# Patient Record
Sex: Male | Born: 1970 | Race: Black or African American | Hispanic: No | Marital: Single | Smoking: Never smoker
Health system: Southern US, Community
[De-identification: ages and names within clinical notes are randomized; demographics above are authoritative.]

## PROBLEM LIST (undated history)

## (undated) DIAGNOSIS — S0990XA Unspecified injury of head, initial encounter: Secondary | ICD-10-CM

## (undated) DIAGNOSIS — E039 Hypothyroidism, unspecified: Secondary | ICD-10-CM

## (undated) DIAGNOSIS — R51 Headache: Secondary | ICD-10-CM

## (undated) DIAGNOSIS — M958 Other specified acquired deformities of musculoskeletal system: Secondary | ICD-10-CM

## (undated) DIAGNOSIS — F319 Bipolar disorder, unspecified: Secondary | ICD-10-CM

## (undated) HISTORY — DX: Bipolar disorder, unspecified: F31.9

## (undated) HISTORY — DX: Other specified acquired deformities of musculoskeletal system: M95.8

## (undated) HISTORY — DX: Unspecified injury of head, initial encounter: S09.90XA

## (undated) HISTORY — DX: Headache: R51

## (undated) HISTORY — DX: Hypothyroidism, unspecified: E03.9

---

## 1997-09-18 ENCOUNTER — Emergency Department (HOSPITAL_COMMUNITY): Admission: EM | Admit: 1997-09-18 | Discharge: 1997-09-18 | Payer: Self-pay | Admitting: Emergency Medicine

## 1997-09-22 ENCOUNTER — Emergency Department (HOSPITAL_COMMUNITY): Admission: EM | Admit: 1997-09-22 | Discharge: 1997-09-22 | Payer: Self-pay | Admitting: Emergency Medicine

## 1997-10-07 ENCOUNTER — Emergency Department (HOSPITAL_COMMUNITY): Admission: EM | Admit: 1997-10-07 | Discharge: 1997-10-07 | Payer: Self-pay | Admitting: Emergency Medicine

## 1998-01-18 ENCOUNTER — Emergency Department (HOSPITAL_COMMUNITY): Admission: EM | Admit: 1998-01-18 | Discharge: 1998-01-18 | Payer: Self-pay | Admitting: Emergency Medicine

## 1998-01-24 ENCOUNTER — Emergency Department (HOSPITAL_COMMUNITY): Admission: EM | Admit: 1998-01-24 | Discharge: 1998-01-24 | Payer: Self-pay | Admitting: Internal Medicine

## 1998-01-24 ENCOUNTER — Encounter: Payer: Self-pay | Admitting: Internal Medicine

## 1998-02-20 ENCOUNTER — Emergency Department (HOSPITAL_COMMUNITY): Admission: EM | Admit: 1998-02-20 | Discharge: 1998-02-20 | Payer: Self-pay | Admitting: Emergency Medicine

## 1998-03-01 ENCOUNTER — Emergency Department (HOSPITAL_COMMUNITY): Admission: EM | Admit: 1998-03-01 | Discharge: 1998-03-01 | Payer: Self-pay | Admitting: Emergency Medicine

## 1998-03-02 ENCOUNTER — Emergency Department (HOSPITAL_COMMUNITY): Admission: EM | Admit: 1998-03-02 | Discharge: 1998-03-02 | Payer: Self-pay

## 1998-07-04 ENCOUNTER — Emergency Department (HOSPITAL_COMMUNITY): Admission: EM | Admit: 1998-07-04 | Discharge: 1998-07-05 | Payer: Self-pay | Admitting: Emergency Medicine

## 1998-08-16 ENCOUNTER — Emergency Department (HOSPITAL_COMMUNITY): Admission: EM | Admit: 1998-08-16 | Discharge: 1998-08-16 | Payer: Self-pay | Admitting: Emergency Medicine

## 1999-03-22 ENCOUNTER — Emergency Department (HOSPITAL_COMMUNITY): Admission: EM | Admit: 1999-03-22 | Discharge: 1999-03-22 | Payer: Self-pay | Admitting: Emergency Medicine

## 1999-08-02 ENCOUNTER — Encounter: Payer: Self-pay | Admitting: Emergency Medicine

## 1999-08-02 ENCOUNTER — Emergency Department (HOSPITAL_COMMUNITY): Admission: EM | Admit: 1999-08-02 | Discharge: 1999-08-02 | Payer: Self-pay | Admitting: Emergency Medicine

## 1999-08-17 ENCOUNTER — Emergency Department (HOSPITAL_COMMUNITY): Admission: EM | Admit: 1999-08-17 | Discharge: 1999-08-17 | Payer: Self-pay | Admitting: Emergency Medicine

## 1999-08-31 ENCOUNTER — Emergency Department (HOSPITAL_COMMUNITY): Admission: EM | Admit: 1999-08-31 | Discharge: 1999-08-31 | Payer: Self-pay | Admitting: Emergency Medicine

## 1999-11-18 ENCOUNTER — Emergency Department (HOSPITAL_COMMUNITY): Admission: EM | Admit: 1999-11-18 | Discharge: 1999-11-18 | Payer: Self-pay | Admitting: Emergency Medicine

## 2000-01-14 ENCOUNTER — Emergency Department (HOSPITAL_COMMUNITY): Admission: EM | Admit: 2000-01-14 | Discharge: 2000-01-15 | Payer: Self-pay | Admitting: *Deleted

## 2000-01-17 ENCOUNTER — Emergency Department (HOSPITAL_COMMUNITY): Admission: EM | Admit: 2000-01-17 | Discharge: 2000-01-17 | Payer: Self-pay | Admitting: Emergency Medicine

## 2000-05-27 ENCOUNTER — Emergency Department (HOSPITAL_COMMUNITY): Admission: EM | Admit: 2000-05-27 | Discharge: 2000-05-28 | Payer: Self-pay | Admitting: Emergency Medicine

## 2000-09-11 ENCOUNTER — Emergency Department (HOSPITAL_COMMUNITY): Admission: EM | Admit: 2000-09-11 | Discharge: 2000-09-11 | Payer: Self-pay

## 2000-11-14 ENCOUNTER — Emergency Department (HOSPITAL_COMMUNITY): Admission: EM | Admit: 2000-11-14 | Discharge: 2000-11-14 | Payer: Self-pay | Admitting: Emergency Medicine

## 2000-12-25 ENCOUNTER — Emergency Department (HOSPITAL_COMMUNITY): Admission: EM | Admit: 2000-12-25 | Discharge: 2000-12-25 | Payer: Self-pay | Admitting: Emergency Medicine

## 2001-03-20 ENCOUNTER — Emergency Department (HOSPITAL_COMMUNITY): Admission: EM | Admit: 2001-03-20 | Discharge: 2001-03-21 | Payer: Self-pay | Admitting: Emergency Medicine

## 2001-03-21 ENCOUNTER — Encounter: Payer: Self-pay | Admitting: Emergency Medicine

## 2001-04-08 ENCOUNTER — Emergency Department (HOSPITAL_COMMUNITY): Admission: EM | Admit: 2001-04-08 | Discharge: 2001-04-08 | Payer: Self-pay | Admitting: Emergency Medicine

## 2001-05-05 ENCOUNTER — Emergency Department (HOSPITAL_COMMUNITY): Admission: EM | Admit: 2001-05-05 | Discharge: 2001-05-05 | Payer: Self-pay | Admitting: Emergency Medicine

## 2001-07-13 ENCOUNTER — Emergency Department (HOSPITAL_COMMUNITY): Admission: EM | Admit: 2001-07-13 | Discharge: 2001-07-13 | Payer: Self-pay | Admitting: *Deleted

## 2001-07-15 ENCOUNTER — Emergency Department (HOSPITAL_COMMUNITY): Admission: EM | Admit: 2001-07-15 | Discharge: 2001-07-15 | Payer: Self-pay | Admitting: Emergency Medicine

## 2002-01-04 ENCOUNTER — Encounter: Payer: Self-pay | Admitting: Emergency Medicine

## 2002-01-04 ENCOUNTER — Emergency Department (HOSPITAL_COMMUNITY): Admission: EM | Admit: 2002-01-04 | Discharge: 2002-01-04 | Payer: Self-pay | Admitting: Emergency Medicine

## 2002-01-16 ENCOUNTER — Emergency Department (HOSPITAL_COMMUNITY): Admission: EM | Admit: 2002-01-16 | Discharge: 2002-01-16 | Payer: Self-pay | Admitting: Emergency Medicine

## 2002-01-20 ENCOUNTER — Encounter: Payer: Self-pay | Admitting: Emergency Medicine

## 2002-01-20 ENCOUNTER — Emergency Department (HOSPITAL_COMMUNITY): Admission: EM | Admit: 2002-01-20 | Discharge: 2002-01-20 | Payer: Self-pay | Admitting: Emergency Medicine

## 2002-02-24 ENCOUNTER — Emergency Department (HOSPITAL_COMMUNITY): Admission: EM | Admit: 2002-02-24 | Discharge: 2002-02-25 | Payer: Self-pay | Admitting: Emergency Medicine

## 2002-02-28 ENCOUNTER — Ambulatory Visit (HOSPITAL_COMMUNITY): Admission: RE | Admit: 2002-02-28 | Discharge: 2002-02-28 | Payer: Self-pay | Admitting: Orthopedic Surgery

## 2002-02-28 ENCOUNTER — Encounter: Payer: Self-pay | Admitting: Orthopedic Surgery

## 2002-05-26 ENCOUNTER — Encounter: Admission: RE | Admit: 2002-05-26 | Discharge: 2002-07-21 | Payer: Self-pay | Admitting: Orthopedic Surgery

## 2002-06-29 ENCOUNTER — Emergency Department (HOSPITAL_COMMUNITY): Admission: EM | Admit: 2002-06-29 | Discharge: 2002-06-29 | Payer: Self-pay | Admitting: Emergency Medicine

## 2002-07-19 ENCOUNTER — Encounter: Payer: Self-pay | Admitting: Orthopedic Surgery

## 2002-07-19 ENCOUNTER — Ambulatory Visit (HOSPITAL_COMMUNITY): Admission: RE | Admit: 2002-07-19 | Discharge: 2002-07-19 | Payer: Self-pay

## 2002-11-20 ENCOUNTER — Encounter: Payer: Self-pay | Admitting: Emergency Medicine

## 2002-11-20 ENCOUNTER — Emergency Department (HOSPITAL_COMMUNITY): Admission: EM | Admit: 2002-11-20 | Discharge: 2002-11-20 | Payer: Self-pay | Admitting: Emergency Medicine

## 2002-11-24 ENCOUNTER — Encounter: Payer: Self-pay | Admitting: Emergency Medicine

## 2002-11-24 ENCOUNTER — Emergency Department (HOSPITAL_COMMUNITY): Admission: EM | Admit: 2002-11-24 | Discharge: 2002-11-24 | Payer: Self-pay | Admitting: Emergency Medicine

## 2003-01-12 ENCOUNTER — Emergency Department (HOSPITAL_COMMUNITY): Admission: EM | Admit: 2003-01-12 | Discharge: 2003-01-12 | Payer: Self-pay | Admitting: Emergency Medicine

## 2003-05-23 ENCOUNTER — Emergency Department (HOSPITAL_COMMUNITY): Admission: EM | Admit: 2003-05-23 | Discharge: 2003-05-23 | Payer: Self-pay | Admitting: Emergency Medicine

## 2003-06-28 ENCOUNTER — Emergency Department (HOSPITAL_COMMUNITY): Admission: EM | Admit: 2003-06-28 | Discharge: 2003-06-28 | Payer: Self-pay | Admitting: Emergency Medicine

## 2003-06-29 ENCOUNTER — Emergency Department (HOSPITAL_COMMUNITY): Admission: EM | Admit: 2003-06-29 | Discharge: 2003-06-29 | Payer: Self-pay | Admitting: Emergency Medicine

## 2003-07-06 ENCOUNTER — Emergency Department (HOSPITAL_COMMUNITY): Admission: EM | Admit: 2003-07-06 | Discharge: 2003-07-06 | Payer: Self-pay | Admitting: Family Medicine

## 2003-07-10 ENCOUNTER — Emergency Department (HOSPITAL_COMMUNITY): Admission: EM | Admit: 2003-07-10 | Discharge: 2003-07-11 | Payer: Self-pay | Admitting: Emergency Medicine

## 2003-07-11 ENCOUNTER — Emergency Department (HOSPITAL_COMMUNITY): Admission: EM | Admit: 2003-07-11 | Discharge: 2003-07-11 | Payer: Self-pay | Admitting: Emergency Medicine

## 2003-09-21 ENCOUNTER — Emergency Department (HOSPITAL_COMMUNITY): Admission: EM | Admit: 2003-09-21 | Discharge: 2003-09-21 | Payer: Self-pay | Admitting: Emergency Medicine

## 2003-09-29 ENCOUNTER — Emergency Department (HOSPITAL_COMMUNITY): Admission: EM | Admit: 2003-09-29 | Discharge: 2003-09-29 | Payer: Self-pay | Admitting: Emergency Medicine

## 2003-10-09 ENCOUNTER — Emergency Department (HOSPITAL_COMMUNITY): Admission: EM | Admit: 2003-10-09 | Discharge: 2003-10-09 | Payer: Self-pay | Admitting: Emergency Medicine

## 2003-10-30 ENCOUNTER — Emergency Department (HOSPITAL_COMMUNITY): Admission: EM | Admit: 2003-10-30 | Discharge: 2003-10-30 | Payer: Self-pay | Admitting: Emergency Medicine

## 2003-11-17 ENCOUNTER — Emergency Department (HOSPITAL_COMMUNITY): Admission: EM | Admit: 2003-11-17 | Discharge: 2003-11-17 | Payer: Self-pay | Admitting: Emergency Medicine

## 2003-11-21 ENCOUNTER — Emergency Department (HOSPITAL_COMMUNITY): Admission: EM | Admit: 2003-11-21 | Discharge: 2003-11-21 | Payer: Self-pay | Admitting: Emergency Medicine

## 2003-12-03 ENCOUNTER — Emergency Department (HOSPITAL_COMMUNITY): Admission: EM | Admit: 2003-12-03 | Discharge: 2003-12-03 | Payer: Self-pay | Admitting: Emergency Medicine

## 2003-12-27 ENCOUNTER — Emergency Department (HOSPITAL_COMMUNITY): Admission: EM | Admit: 2003-12-27 | Discharge: 2003-12-27 | Payer: Self-pay | Admitting: Emergency Medicine

## 2004-01-01 ENCOUNTER — Emergency Department (HOSPITAL_COMMUNITY): Admission: EM | Admit: 2004-01-01 | Discharge: 2004-01-01 | Payer: Self-pay

## 2004-01-19 ENCOUNTER — Emergency Department (HOSPITAL_COMMUNITY): Admission: EM | Admit: 2004-01-19 | Discharge: 2004-01-19 | Payer: Self-pay | Admitting: Emergency Medicine

## 2004-01-23 ENCOUNTER — Emergency Department (HOSPITAL_COMMUNITY): Admission: EM | Admit: 2004-01-23 | Discharge: 2004-01-24 | Payer: Self-pay | Admitting: Emergency Medicine

## 2004-01-30 ENCOUNTER — Emergency Department (HOSPITAL_COMMUNITY): Admission: EM | Admit: 2004-01-30 | Discharge: 2004-01-31 | Payer: Self-pay | Admitting: Emergency Medicine

## 2004-02-01 ENCOUNTER — Emergency Department (HOSPITAL_COMMUNITY): Admission: EM | Admit: 2004-02-01 | Discharge: 2004-02-01 | Payer: Self-pay | Admitting: Emergency Medicine

## 2004-12-21 ENCOUNTER — Emergency Department (HOSPITAL_COMMUNITY): Admission: EM | Admit: 2004-12-21 | Discharge: 2004-12-21 | Payer: Self-pay | Admitting: Emergency Medicine

## 2004-12-29 ENCOUNTER — Emergency Department (HOSPITAL_COMMUNITY): Admission: EM | Admit: 2004-12-29 | Discharge: 2004-12-29 | Payer: Self-pay | Admitting: Emergency Medicine

## 2005-01-03 ENCOUNTER — Emergency Department (HOSPITAL_COMMUNITY): Admission: EM | Admit: 2005-01-03 | Discharge: 2005-01-03 | Payer: Self-pay | Admitting: Emergency Medicine

## 2005-01-05 ENCOUNTER — Emergency Department (HOSPITAL_COMMUNITY): Admission: EM | Admit: 2005-01-05 | Discharge: 2005-01-05 | Payer: Self-pay | Admitting: Emergency Medicine

## 2005-01-06 ENCOUNTER — Emergency Department (HOSPITAL_COMMUNITY): Admission: EM | Admit: 2005-01-06 | Discharge: 2005-01-06 | Payer: Self-pay | Admitting: Emergency Medicine

## 2005-01-10 ENCOUNTER — Emergency Department (HOSPITAL_COMMUNITY): Admission: EM | Admit: 2005-01-10 | Discharge: 2005-01-10 | Payer: Self-pay | Admitting: Emergency Medicine

## 2005-02-06 ENCOUNTER — Emergency Department (HOSPITAL_COMMUNITY): Admission: EM | Admit: 2005-02-06 | Discharge: 2005-02-06 | Payer: Self-pay | Admitting: Emergency Medicine

## 2005-04-23 ENCOUNTER — Emergency Department (HOSPITAL_COMMUNITY): Admission: EM | Admit: 2005-04-23 | Discharge: 2005-04-23 | Payer: Self-pay | Admitting: Emergency Medicine

## 2005-05-02 ENCOUNTER — Emergency Department (HOSPITAL_COMMUNITY): Admission: EM | Admit: 2005-05-02 | Discharge: 2005-05-02 | Payer: Self-pay | Admitting: Emergency Medicine

## 2005-05-10 ENCOUNTER — Emergency Department (HOSPITAL_COMMUNITY): Admission: EM | Admit: 2005-05-10 | Discharge: 2005-05-10 | Payer: Self-pay | Admitting: Emergency Medicine

## 2005-10-18 ENCOUNTER — Emergency Department (HOSPITAL_COMMUNITY): Admission: EM | Admit: 2005-10-18 | Discharge: 2005-10-18 | Payer: Self-pay | Admitting: Emergency Medicine

## 2005-11-29 ENCOUNTER — Emergency Department (HOSPITAL_COMMUNITY): Admission: EM | Admit: 2005-11-29 | Discharge: 2005-11-29 | Payer: Self-pay | Admitting: Emergency Medicine

## 2006-01-13 ENCOUNTER — Emergency Department (HOSPITAL_COMMUNITY): Admission: EM | Admit: 2006-01-13 | Discharge: 2006-01-13 | Payer: Self-pay | Admitting: Emergency Medicine

## 2006-03-01 ENCOUNTER — Emergency Department (HOSPITAL_COMMUNITY): Admission: EM | Admit: 2006-03-01 | Discharge: 2006-03-01 | Payer: Self-pay | Admitting: Emergency Medicine

## 2006-05-12 ENCOUNTER — Emergency Department (HOSPITAL_COMMUNITY): Admission: EM | Admit: 2006-05-12 | Discharge: 2006-05-12 | Payer: Self-pay | Admitting: Emergency Medicine

## 2006-05-12 ENCOUNTER — Emergency Department (HOSPITAL_COMMUNITY): Admission: EM | Admit: 2006-05-12 | Discharge: 2006-05-13 | Payer: Self-pay | Admitting: Emergency Medicine

## 2006-05-19 ENCOUNTER — Emergency Department (HOSPITAL_COMMUNITY): Admission: EM | Admit: 2006-05-19 | Discharge: 2006-05-19 | Payer: Self-pay | Admitting: Emergency Medicine

## 2006-05-26 ENCOUNTER — Emergency Department (HOSPITAL_COMMUNITY): Admission: EM | Admit: 2006-05-26 | Discharge: 2006-05-26 | Payer: Self-pay | Admitting: Family Medicine

## 2006-06-30 ENCOUNTER — Emergency Department (HOSPITAL_COMMUNITY): Admission: EM | Admit: 2006-06-30 | Discharge: 2006-06-30 | Payer: Self-pay | Admitting: *Deleted

## 2006-07-07 ENCOUNTER — Emergency Department (HOSPITAL_COMMUNITY): Admission: EM | Admit: 2006-07-07 | Discharge: 2006-07-07 | Payer: Self-pay | Admitting: Family Medicine

## 2006-11-19 ENCOUNTER — Emergency Department (HOSPITAL_COMMUNITY): Admission: EM | Admit: 2006-11-19 | Discharge: 2006-11-19 | Payer: Self-pay | Admitting: Emergency Medicine

## 2006-12-03 ENCOUNTER — Emergency Department (HOSPITAL_COMMUNITY): Admission: EM | Admit: 2006-12-03 | Discharge: 2006-12-03 | Payer: Self-pay | Admitting: Emergency Medicine

## 2006-12-04 ENCOUNTER — Emergency Department (HOSPITAL_COMMUNITY): Admission: EM | Admit: 2006-12-04 | Discharge: 2006-12-04 | Payer: Self-pay | Admitting: Emergency Medicine

## 2007-01-25 ENCOUNTER — Emergency Department (HOSPITAL_COMMUNITY): Admission: EM | Admit: 2007-01-25 | Discharge: 2007-01-25 | Payer: Self-pay | Admitting: Emergency Medicine

## 2007-07-24 ENCOUNTER — Emergency Department (HOSPITAL_COMMUNITY): Admission: EM | Admit: 2007-07-24 | Discharge: 2007-07-24 | Payer: Self-pay | Admitting: Emergency Medicine

## 2007-08-15 ENCOUNTER — Emergency Department (HOSPITAL_COMMUNITY): Admission: EM | Admit: 2007-08-15 | Discharge: 2007-08-15 | Payer: Self-pay | Admitting: Emergency Medicine

## 2008-03-25 DIAGNOSIS — S0990XA Unspecified injury of head, initial encounter: Secondary | ICD-10-CM

## 2008-03-25 HISTORY — DX: Unspecified injury of head, initial encounter: S09.90XA

## 2008-07-23 ENCOUNTER — Emergency Department (HOSPITAL_COMMUNITY): Admission: EM | Admit: 2008-07-23 | Discharge: 2008-07-23 | Payer: Self-pay | Admitting: Emergency Medicine

## 2008-07-23 HISTORY — PX: OTHER SURGICAL HISTORY: SHX169

## 2008-07-24 ENCOUNTER — Emergency Department (HOSPITAL_COMMUNITY): Admission: EM | Admit: 2008-07-24 | Discharge: 2008-07-24 | Payer: Self-pay | Admitting: Emergency Medicine

## 2008-07-27 ENCOUNTER — Emergency Department (HOSPITAL_COMMUNITY): Admission: EM | Admit: 2008-07-27 | Discharge: 2008-07-27 | Payer: Self-pay | Admitting: Emergency Medicine

## 2008-07-28 ENCOUNTER — Emergency Department (HOSPITAL_COMMUNITY): Admission: EM | Admit: 2008-07-28 | Discharge: 2008-07-28 | Payer: Self-pay | Admitting: Emergency Medicine

## 2008-07-30 ENCOUNTER — Emergency Department (HOSPITAL_COMMUNITY): Admission: EM | Admit: 2008-07-30 | Discharge: 2008-07-31 | Payer: Self-pay | Admitting: Emergency Medicine

## 2008-08-05 ENCOUNTER — Emergency Department (HOSPITAL_COMMUNITY): Admission: EM | Admit: 2008-08-05 | Discharge: 2008-08-05 | Payer: Self-pay | Admitting: Emergency Medicine

## 2008-08-12 ENCOUNTER — Emergency Department (HOSPITAL_COMMUNITY): Admission: EM | Admit: 2008-08-12 | Discharge: 2008-08-12 | Payer: Self-pay | Admitting: Emergency Medicine

## 2008-08-13 ENCOUNTER — Emergency Department (HOSPITAL_COMMUNITY): Admission: EM | Admit: 2008-08-13 | Discharge: 2008-08-13 | Payer: Self-pay | Admitting: Emergency Medicine

## 2008-08-30 ENCOUNTER — Ambulatory Visit: Payer: Self-pay | Admitting: Internal Medicine

## 2008-08-30 ENCOUNTER — Encounter (INDEPENDENT_AMBULATORY_CARE_PROVIDER_SITE_OTHER): Payer: Self-pay | Admitting: *Deleted

## 2008-08-30 DIAGNOSIS — M2180 Other specified acquired deformities of unspecified limb: Secondary | ICD-10-CM | POA: Insufficient documentation

## 2008-08-30 DIAGNOSIS — R51 Headache: Secondary | ICD-10-CM | POA: Insufficient documentation

## 2008-08-30 DIAGNOSIS — F319 Bipolar disorder, unspecified: Secondary | ICD-10-CM | POA: Insufficient documentation

## 2008-08-30 DIAGNOSIS — R519 Headache, unspecified: Secondary | ICD-10-CM | POA: Insufficient documentation

## 2008-08-30 DIAGNOSIS — H538 Other visual disturbances: Secondary | ICD-10-CM | POA: Insufficient documentation

## 2008-10-31 IMAGING — CR DG SHOULDER 2+V*R*
4 series · 4 of 4 positions shown · non-contrast
Comparison: 11/29/04.

CLINICAL DATA: Pain.
 RIGHT SHOULDER ? 4 VIEW:

[w shoulder ap internal right]
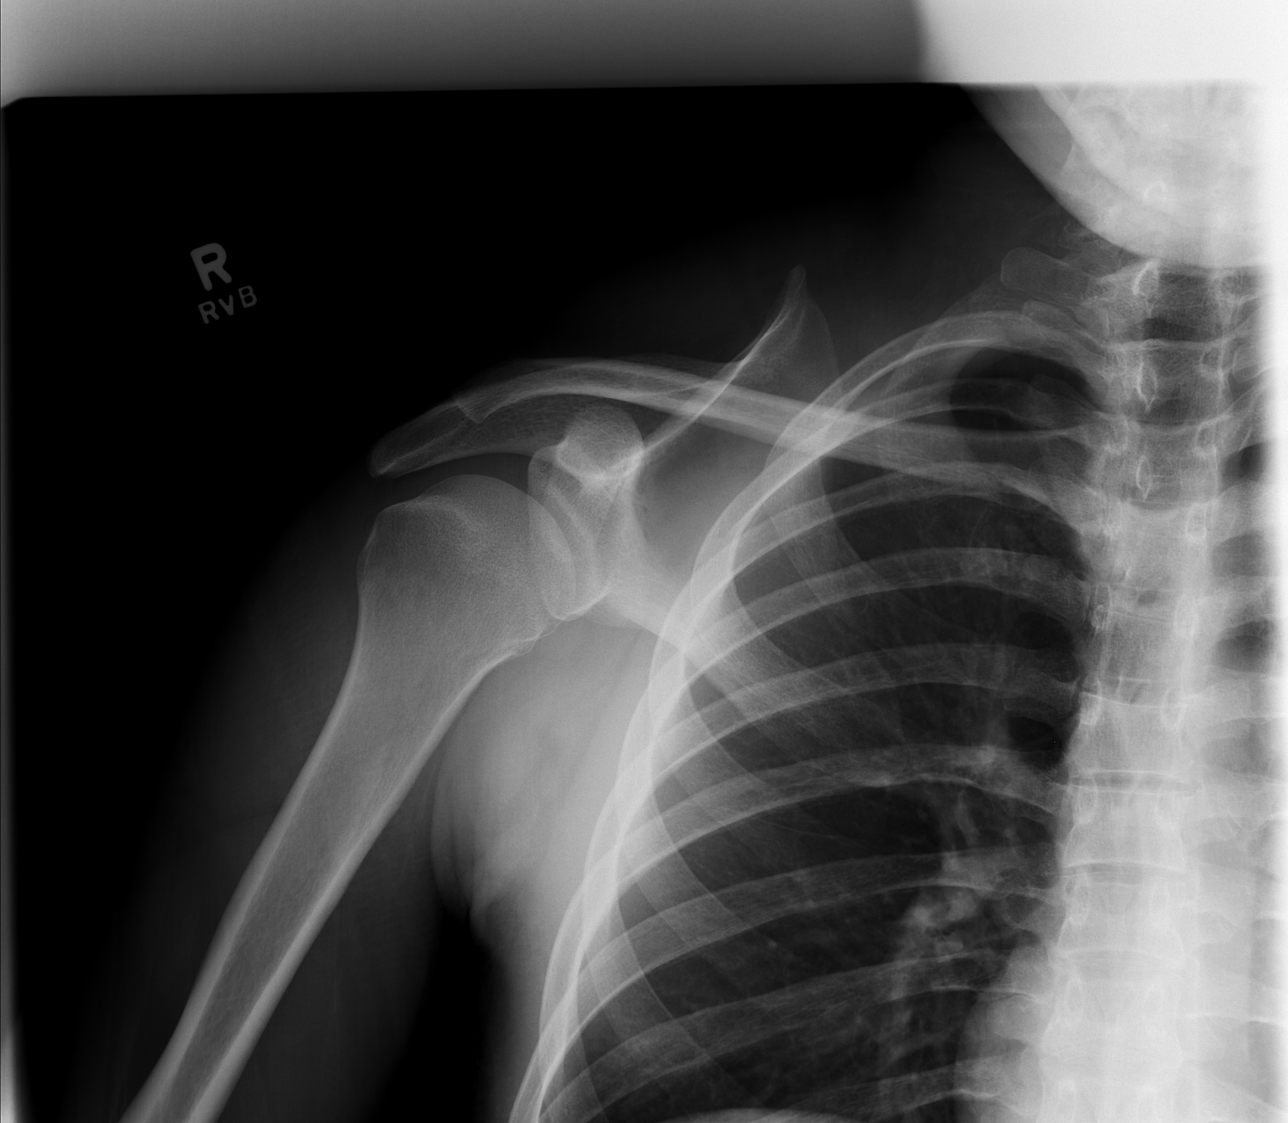

[w shoulder ap external right]
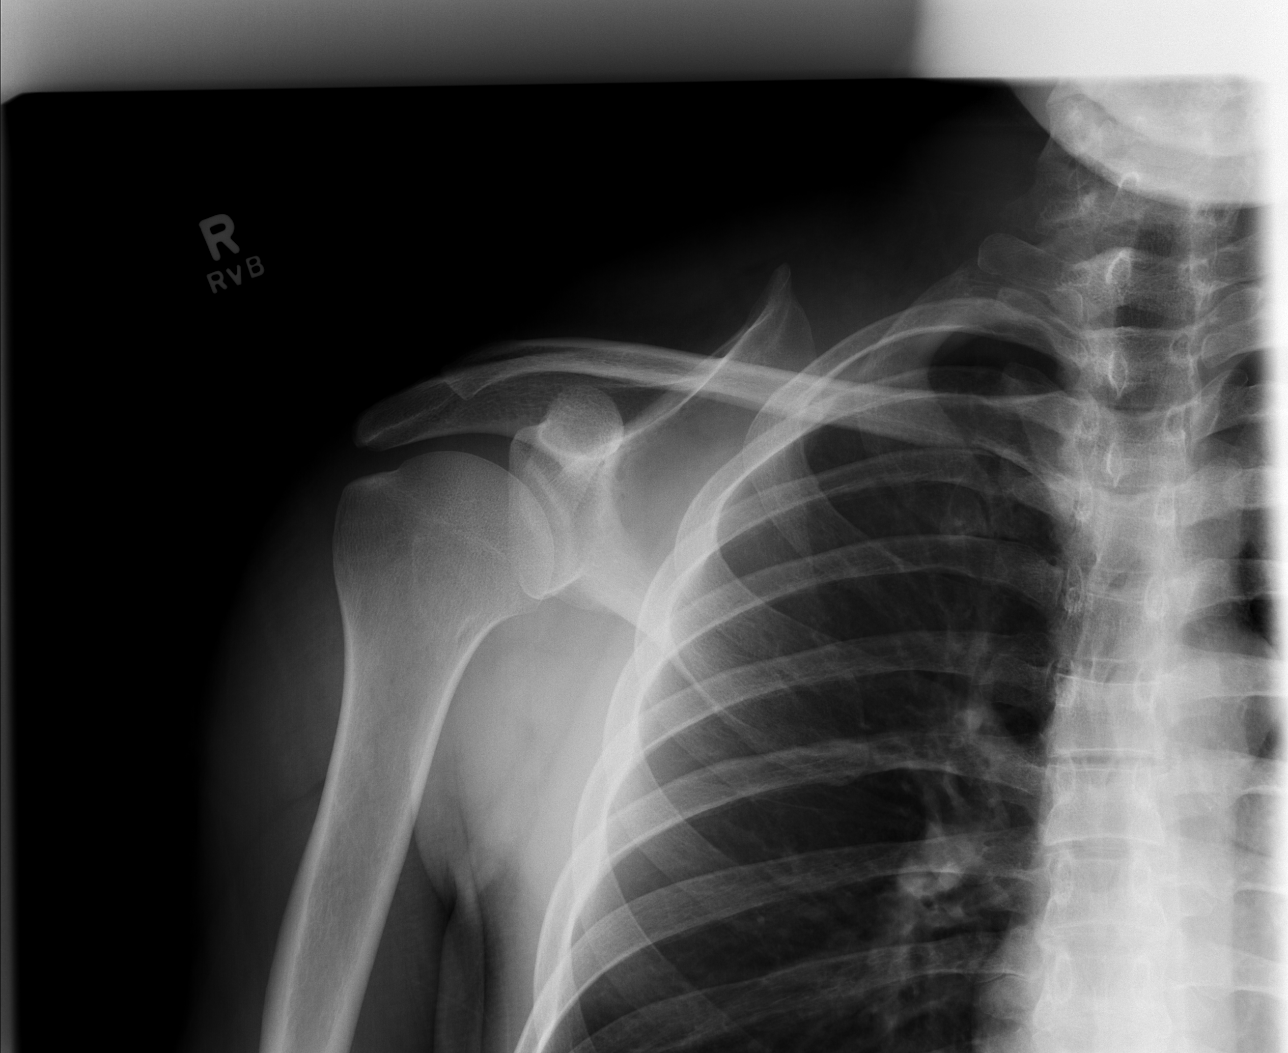

[w shoulder y view right (1 of 2)]
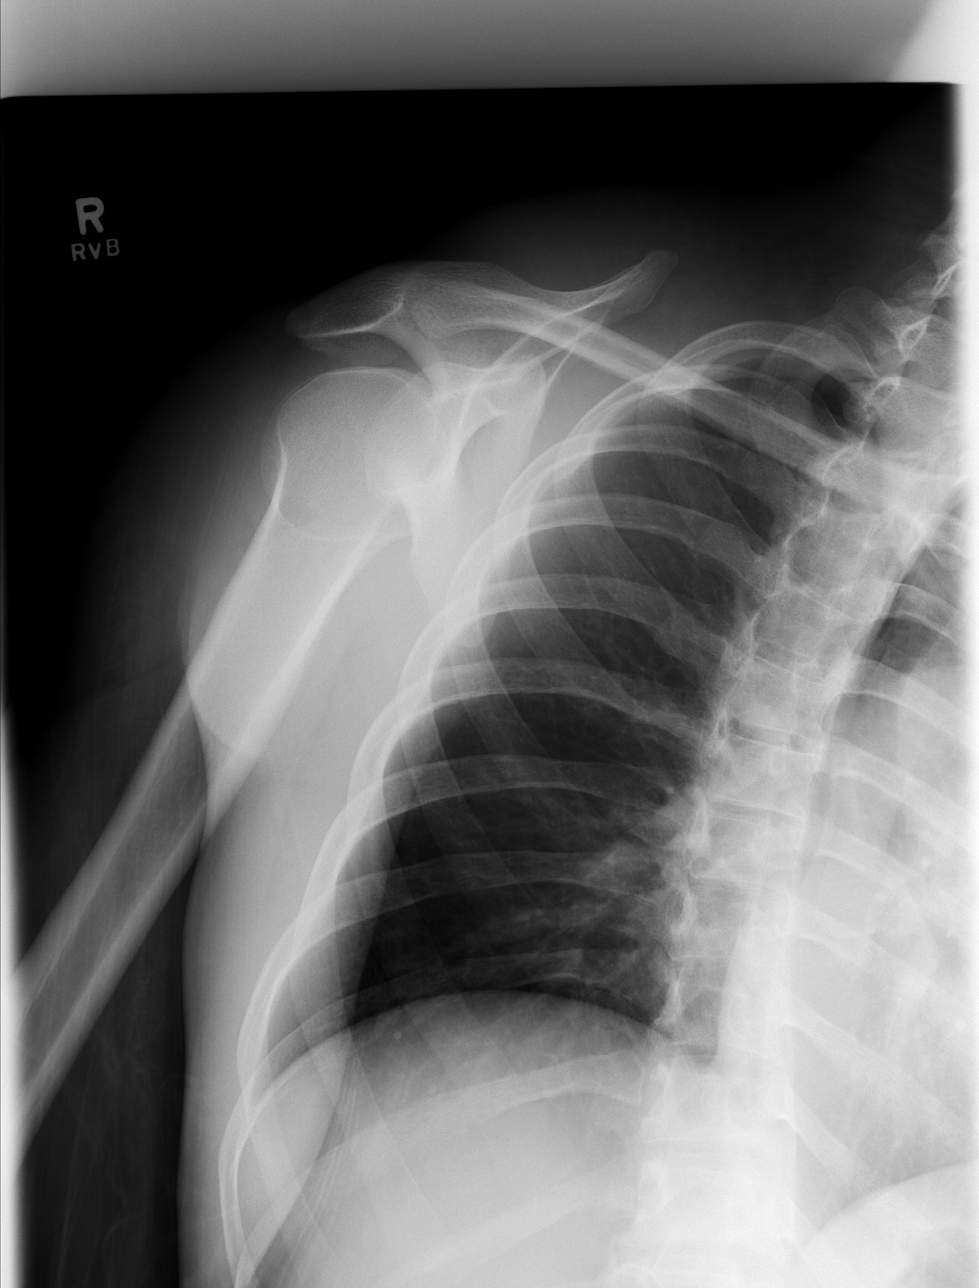

[w shoulder y view right (2 of 2)]
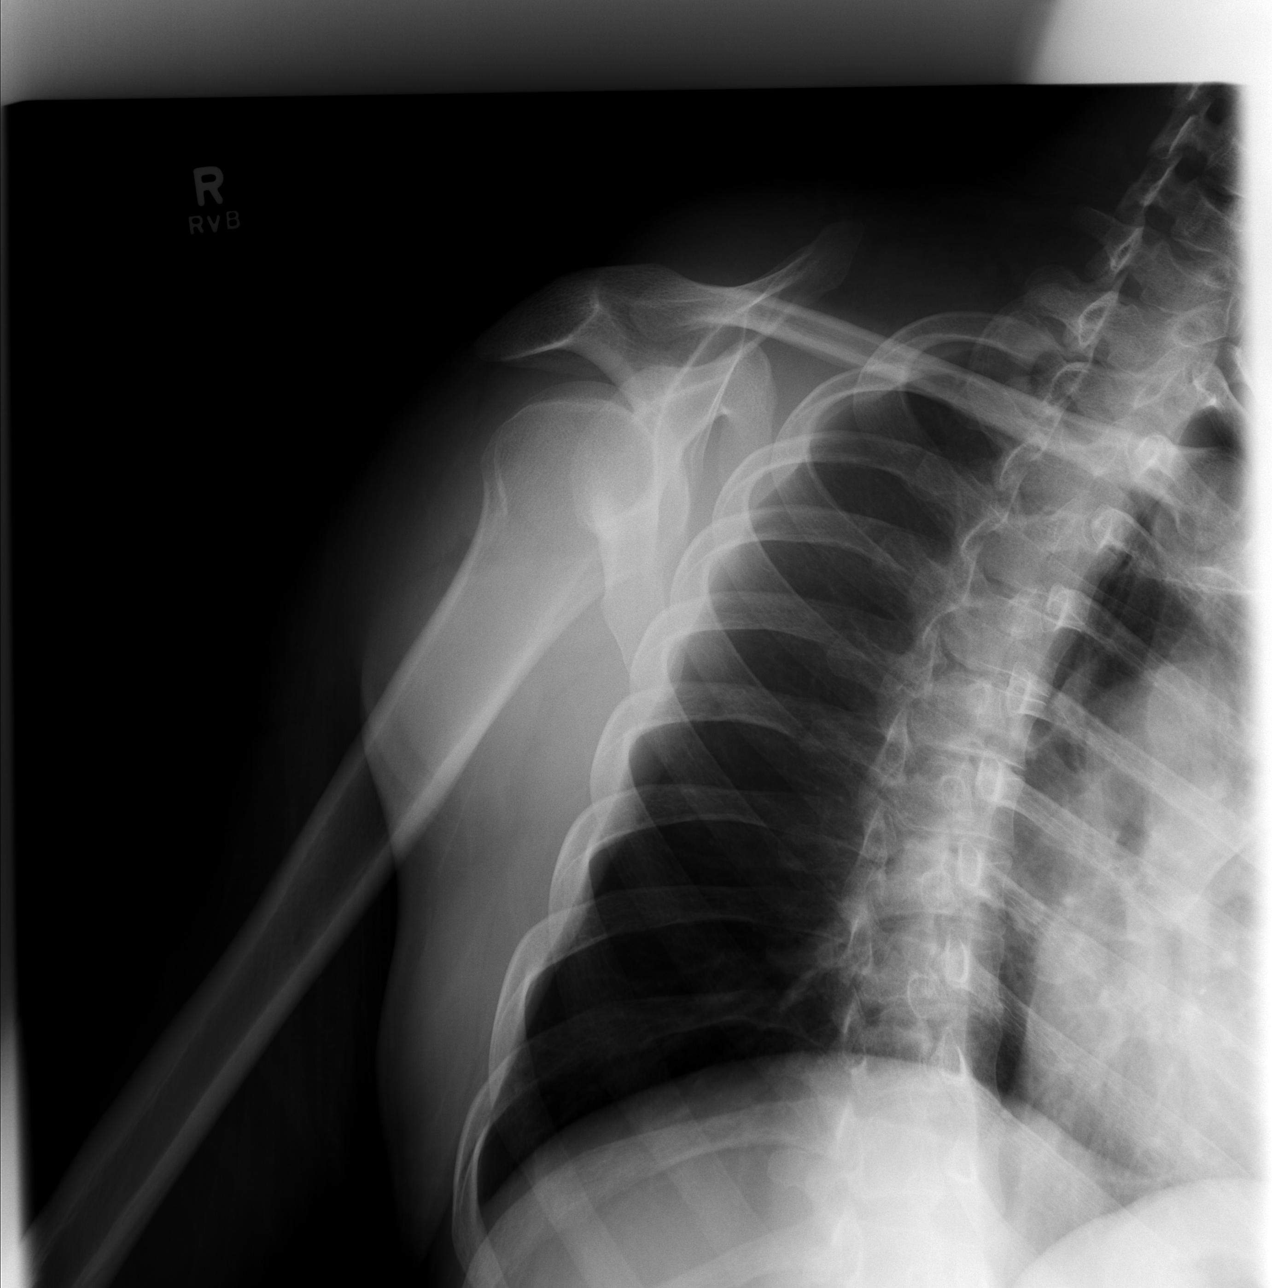

[4 of 4 positions shown; findings below may reference images not displayed]

FINDINGS: The humerus is located.  No fracture is identified.  Winged appearance of the scapula is again noted.  Imaged ribs and lung are clear.
IMPRESSION: No acute finding or interval change.

## 2008-12-05 ENCOUNTER — Emergency Department (HOSPITAL_COMMUNITY): Admission: EM | Admit: 2008-12-05 | Discharge: 2008-12-05 | Payer: Self-pay | Admitting: Emergency Medicine

## 2009-01-17 ENCOUNTER — Encounter: Payer: Self-pay | Admitting: Internal Medicine

## 2009-04-02 ENCOUNTER — Emergency Department (HOSPITAL_COMMUNITY): Admission: EM | Admit: 2009-04-02 | Discharge: 2009-04-02 | Payer: Self-pay | Admitting: Emergency Medicine

## 2009-08-08 ENCOUNTER — Encounter: Payer: Self-pay | Admitting: Internal Medicine

## 2009-08-08 ENCOUNTER — Ambulatory Visit: Payer: Self-pay | Admitting: Internal Medicine

## 2009-08-08 DIAGNOSIS — R5383 Other fatigue: Secondary | ICD-10-CM

## 2009-08-08 DIAGNOSIS — R5381 Other malaise: Secondary | ICD-10-CM | POA: Insufficient documentation

## 2009-08-09 ENCOUNTER — Encounter: Payer: Self-pay | Admitting: Internal Medicine

## 2009-08-09 LAB — CONVERTED CEMR LAB
ALT: <8 units/L (ref 0–53)
AST: 18 units/L (ref 0–37)
Albumin: 4.3 g/dL (ref 3.5–5.2)
Alkaline Phosphatase: 44 units/L (ref 39–117)
BUN: 15 mg/dL (ref 6–23)
Basophils Absolute: 0 10*3/uL (ref 0.0–0.1)
Basophils Relative: 1 % (ref 0–1)
CO2: 26 meq/L (ref 19–32)
Calcium: 8.9 mg/dL (ref 8.4–10.5)
Chloride: 103 meq/L (ref 96–112)
Creatinine, Ser: 1.37 mg/dL (ref 0.40–1.50)
Eosinophils Absolute: 0.4 10*3/uL (ref 0.0–0.7)
Eosinophils Relative: 6 % — ABNORMAL HIGH (ref 0–5)
Glucose, Bld: 66 mg/dL — ABNORMAL LOW (ref 70–99)
HCT: 43.7 % (ref 39.0–52.0)
Hemoglobin: 14.8 g/dL (ref 13.0–17.0)
Lymphocytes Relative: 45 % (ref 12–46)
Lymphs Abs: 2.5 10*3/uL (ref 0.7–4.0)
MCHC: 33.9 g/dL (ref 30.0–36.0)
MCV: 94.2 fL (ref >=78.0)
Monocytes Absolute: 0.5 10*3/uL (ref 0.1–1.0)
Monocytes Relative: 9 % (ref 3–12)
Neutro Abs: 2.2 10*3/uL (ref 1.7–7.7)
Neutrophils Relative %: 40 % — ABNORMAL LOW (ref 43–77)
Platelets: 195 10*3/uL (ref 150–400)
Potassium: 4.1 meq/L (ref 3.5–5.3)
RBC: 4.64 M/uL (ref 4.22–5.81)
RDW: 14.2 % (ref 11.5–15.5)
Sodium: 140 meq/L (ref 135–145)
TSH: 6.795 microintl units/mL — ABNORMAL HIGH (ref 0.350–4.5)
Total Bilirubin: 0.3 mg/dL (ref 0.3–1.2)
Total Protein: 7.4 g/dL (ref 6.0–8.3)
WBC: 5.7 10*3/uL (ref 4.0–10.5)

## 2009-08-11 DIAGNOSIS — E039 Hypothyroidism, unspecified: Secondary | ICD-10-CM | POA: Insufficient documentation

## 2009-08-11 LAB — CONVERTED CEMR LAB: Free T4: 0.74 ng/dL — ABNORMAL LOW (ref 0.80–1.80)

## 2009-09-07 ENCOUNTER — Telehealth: Payer: Self-pay | Admitting: Internal Medicine

## 2009-10-19 ENCOUNTER — Encounter: Payer: Self-pay | Admitting: Internal Medicine

## 2009-11-22 ENCOUNTER — Emergency Department (HOSPITAL_COMMUNITY): Admission: EM | Admit: 2009-11-22 | Discharge: 2009-11-22 | Payer: Self-pay | Admitting: Emergency Medicine
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
## 2010-04-24 NOTE — Progress Notes (Signed)
Summary: phone/gg  Phone Note Call from Patient   Summary of Call: Pt came by office and was concerned about his Dx. and taking synthroid.   He would love a call from you to explain alittle about both.   He is anxious. His # is [phone] Initial call taken by: Merrie Roof RN,  September 07, 2009 4:56 PM  Follow-up for Phone Call        Called him twice today and nobody is picking up the phone.  Follow-up by: Blondell Reveal MD,  September 07, 2009 5:38 PM  Additional Follow-up for Phone Call Additional follow up Details #1::        Called at [phone] and talked to patients Guardian, who said the patient started taking his synthroid and has been doing well since started on this medicine. I recommended to call at [phone] if he wants to talk to me.

## 2010-05-13 ENCOUNTER — Emergency Department (HOSPITAL_COMMUNITY)
Admission: EM | Admit: 2010-05-13 | Discharge: 2010-05-13 | Disposition: A | Discharge status: Home / Self Care | Payer: Self-pay | Attending: Emergency Medicine | Admitting: Emergency Medicine

## 2010-05-13 DIAGNOSIS — E039 Hypothyroidism, unspecified: Secondary | ICD-10-CM | POA: Insufficient documentation

## 2010-05-13 DIAGNOSIS — M79609 Pain in unspecified limb: Secondary | ICD-10-CM | POA: Insufficient documentation

## 2010-05-13 DIAGNOSIS — B353 Tinea pedis: Secondary | ICD-10-CM | POA: Insufficient documentation

## 2010-06-07 LAB — GLUCOSE, CAPILLARY: Glucose-Capillary: 94 mg/dL (ref 70–99)

## 2010-07-03 LAB — POCT I-STAT, CHEM 8
BUN: 9 mg/dL (ref 6–23)
Calcium, Ion: 1.09 mmol/L — ABNORMAL LOW (ref 1.12–1.32)
Chloride: 103 mEq/L (ref 96–112)
Creatinine, Ser: 1.9 mg/dL — ABNORMAL HIGH (ref 0.4–1.5)
Glucose, Bld: 92 mg/dL (ref 70–99)
HCT: 48 % (ref 39.0–52.0)
Hemoglobin: 16.3 g/dL (ref 13.0–17.0)
Potassium: 3.8 mEq/L (ref 3.5–5.1)
Sodium: 139 mEq/L (ref 135–145)
TCO2: 25 mmol/L (ref 0–100)

## 2010-07-03 LAB — POCT CARDIAC MARKERS
CKMB, poc: 1.1 ng/mL (ref 1.0–8.0)
Myoglobin, poc: 71.5 ng/mL (ref 12–200)
Troponin i, poc: <0.05 ng/mL (ref 0.00–0.09)

## 2010-07-10 ENCOUNTER — Encounter: Payer: Self-pay | Admitting: Ophthalmology

## 2010-10-23 ENCOUNTER — Encounter: Payer: Self-pay | Admitting: Internal Medicine

## 2010-10-23 ENCOUNTER — Ambulatory Visit (INDEPENDENT_AMBULATORY_CARE_PROVIDER_SITE_OTHER): Payer: Self-pay | Admitting: Internal Medicine

## 2010-10-23 VITALS — BP 109/70 | HR 99 | Temp 98.6°F | Ht 66.0 in | Wt 156.8 lb

## 2010-10-23 DIAGNOSIS — M2180 Other specified acquired deformities of unspecified limb: Secondary | ICD-10-CM

## 2010-10-23 DIAGNOSIS — E039 Hypothyroidism, unspecified: Secondary | ICD-10-CM

## 2010-10-23 DIAGNOSIS — F319 Bipolar disorder, unspecified: Secondary | ICD-10-CM

## 2010-10-23 LAB — TSH: TSH: 2.908 u[IU]/mL (ref 0.350–4.500)

## 2010-10-23 MED ORDER — LEVOTHYROXINE SODIUM 25 MCG PO TABS: 25.0000 ug | ORAL_TABLET | Freq: Every day | ORAL | Status: DC

## 2010-10-23 MED ORDER — MELOXICAM 7.5 MG PO TABS: 7.5000 mg | ORAL_TABLET | Freq: Every day | ORAL | Status: DC
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
## 2010-10-23 NOTE — Assessment & Plan Note (Signed)
Winged scapula, causing pain. Will try patient on Meloxicam once a day.

## 2010-10-23 NOTE — Patient Instructions (Signed)
Please follow up at [facility] to get medications for Bipolar Disorder.  Please follow up with Jaynee Eagles for financial counseling.  Please restart taking Thyroid medication once in the morning before breakfast.

## 2010-10-23 NOTE — Assessment & Plan Note (Signed)
Patient has a history of hypothyroidism with last TSH checked over one year ago. Patient has not been taking his thyroid medication as directed. Patient is complaining of fatigue as well as changes with hair. This is likely secondary to poorly controlled hypothyroidism. Patient will be restarted on Synthroid at original dose of 25 mcg. Patient will get a TSH and free T4 level checked today. We will adjust the dose of Synthroid according to his thyroid function tests.

## 2010-10-24 NOTE — Assessment & Plan Note (Signed)
Patient with complaints of fluctuating energy levels, paranoia, mood swings, and being easily distracted. Patient states that when he starts to feel like this he needs to be on medication. However patient stopped taking his medication as he was told that he would have to pay for his medications. He is unable to afford them and discuss stopped going to the behavioral [facility]. Patient was provided with 2 small boxes of Seroquel samples with 4 tablets in as patient was previously on Seroquel. Patient was instructed to [facility] with [facility] to get restarted on his medications. Patient reports that he was planning on going to the health department after this appointment to schedule followup. Patient will also followup with Jaynee Eagles for financial counseling in obtaining the [facility] card.

## 2010-10-25 LAB — T4, FREE: Free T4: 0.76 NG/DL — ABNORMAL LOW (ref 0.80–1.70)

## 2010-12-05 ENCOUNTER — Emergency Department (HOSPITAL_COMMUNITY)
Admission: EM | Admit: 2010-12-05 | Discharge: 2010-12-05 | Disposition: A | Discharge status: Home / Self Care | Payer: Self-pay | Attending: Emergency Medicine | Admitting: Emergency Medicine

## 2010-12-05 DIAGNOSIS — F3130 Bipolar disorder, current episode depressed, mild or moderate severity, unspecified: Secondary | ICD-10-CM | POA: Insufficient documentation

## 2010-12-05 DIAGNOSIS — E039 Hypothyroidism, unspecified: Secondary | ICD-10-CM | POA: Insufficient documentation

## 2010-12-05 DIAGNOSIS — Z79899 Other long term (current) drug therapy: Secondary | ICD-10-CM | POA: Insufficient documentation

## 2010-12-05 DIAGNOSIS — F411 Generalized anxiety disorder: Secondary | ICD-10-CM | POA: Insufficient documentation

## 2010-12-05 DIAGNOSIS — R443 Hallucinations, unspecified: Secondary | ICD-10-CM | POA: Insufficient documentation

## 2010-12-09 ENCOUNTER — Emergency Department (HOSPITAL_COMMUNITY)
Admission: EM | Admit: 2010-12-09 | Discharge: 2010-12-10 | Disposition: A | Discharge status: Home / Self Care | Payer: Self-pay | Attending: Emergency Medicine | Admitting: Emergency Medicine

## 2010-12-09 DIAGNOSIS — R5381 Other malaise: Secondary | ICD-10-CM | POA: Insufficient documentation

## 2010-12-09 DIAGNOSIS — R05 Cough: Secondary | ICD-10-CM | POA: Insufficient documentation

## 2010-12-09 DIAGNOSIS — R059 Cough, unspecified: Secondary | ICD-10-CM | POA: Insufficient documentation

## 2010-12-09 DIAGNOSIS — E039 Hypothyroidism, unspecified: Secondary | ICD-10-CM | POA: Insufficient documentation

## 2010-12-09 DIAGNOSIS — R5383 Other fatigue: Secondary | ICD-10-CM | POA: Insufficient documentation

## 2010-12-10 LAB — POCT I-STAT, CHEM 8
BUN: 17 mg/dL (ref 6–23)
Calcium, Ion: 1.14 mmol/L (ref 1.12–1.32)
Chloride: 105 mEq/L (ref 96–112)
Creatinine, Ser: 1.3 mg/dL (ref 0.50–1.35)
HCT: 47 % (ref 39.0–52.0)
Hemoglobin: 16 g/dL (ref 13.0–17.0)
Potassium: 3.7 mEq/L (ref 3.5–5.1)
Sodium: 141 mEq/L (ref 135–145)
TCO2: 24 mmol/L (ref 0–100)

## 2011-01-30 ENCOUNTER — Encounter (HOSPITAL_COMMUNITY): Payer: Self-pay | Admitting: *Deleted

## 2011-01-30 ENCOUNTER — Emergency Department (HOSPITAL_COMMUNITY)
Admission: EM | Admit: 2011-01-30 | Discharge: 2011-01-31 | Disposition: A | Discharge status: Home / Self Care | Payer: Self-pay | Attending: Emergency Medicine | Admitting: Emergency Medicine

## 2011-01-30 DIAGNOSIS — J029 Acute pharyngitis, unspecified: Secondary | ICD-10-CM | POA: Insufficient documentation

## 2011-01-30 DIAGNOSIS — E039 Hypothyroidism, unspecified: Secondary | ICD-10-CM | POA: Insufficient documentation

## 2011-01-30 DIAGNOSIS — F319 Bipolar disorder, unspecified: Secondary | ICD-10-CM | POA: Insufficient documentation

## 2011-01-30 DIAGNOSIS — S46911A Strain of unspecified muscle, fascia and tendon at shoulder and upper arm level, right arm, initial encounter: Secondary | ICD-10-CM

## 2011-01-30 DIAGNOSIS — IMO0002 Reserved for concepts with insufficient information to code with codable children: Secondary | ICD-10-CM | POA: Insufficient documentation

## 2011-01-30 DIAGNOSIS — X58XXXA Exposure to other specified factors, initial encounter: Secondary | ICD-10-CM | POA: Insufficient documentation

## 2011-01-30 NOTE — ED Notes (Signed)
Pt in c/o sore throat and right shoulder pain x2 days

## 2011-01-31 LAB — RAPID STREP SCREEN (MED CTR MEBANE ONLY): Streptococcus, Group A Screen (Direct): NEGATIVE

## 2011-01-31 MED ORDER — DICLOFENAC SODIUM 75 MG PO TBEC: 75.0000 mg | DELAYED_RELEASE_TABLET | Freq: Two times a day (BID) | ORAL | Status: AC

## 2011-01-31 MED ORDER — LIDOCAINE VISCOUS 2 % MT SOLN: 20.0000 mL | OROMUCOSAL | Status: AC | PRN

## 2011-01-31 MED ORDER — METHOCARBAMOL 500 MG PO TABS: 500.0000 mg | ORAL_TABLET | Freq: Two times a day (BID) | ORAL | Status: AC
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
## 2011-01-31 NOTE — ED Provider Notes (Signed)
Medical screening examination/treatment/procedure(s) were conducted as a shared visit with non-physician practitioner(s) and myself.  I personally evaluated the patient during the encounter  Olivia Mackie, MD 01/31/11 [phone]

## 2011-02-04 NOTE — ED Provider Notes (Signed)
Medical screening examination/treatment/procedure(s) were performed by non-physician practitioner and as supervising physician I was immediately available for consultation/collaboration.  Olivia Mackie, MD 02/04/11 [phone]

## 2013-07-05 ENCOUNTER — Encounter (HOSPITAL_COMMUNITY): Payer: Self-pay | Admitting: Emergency Medicine

## 2013-07-05 ENCOUNTER — Emergency Department (HOSPITAL_COMMUNITY)
Admission: EM | Admit: 2013-07-05 | Discharge: 2013-07-05 | Disposition: A | Discharge status: Home / Self Care | Payer: Medicaid - Out of State | Attending: Emergency Medicine | Admitting: Emergency Medicine

## 2013-07-05 DIAGNOSIS — E039 Hypothyroidism, unspecified: Secondary | ICD-10-CM | POA: Insufficient documentation

## 2013-07-05 DIAGNOSIS — Z87828 Personal history of other (healed) physical injury and trauma: Secondary | ICD-10-CM | POA: Insufficient documentation

## 2013-07-05 DIAGNOSIS — Z79899 Other long term (current) drug therapy: Secondary | ICD-10-CM | POA: Insufficient documentation

## 2013-07-05 DIAGNOSIS — Z760 Encounter for issue of repeat prescription: Secondary | ICD-10-CM | POA: Insufficient documentation

## 2013-07-05 DIAGNOSIS — Z8739 Personal history of other diseases of the musculoskeletal system and connective tissue: Secondary | ICD-10-CM | POA: Insufficient documentation

## 2013-07-05 DIAGNOSIS — Z8659 Personal history of other mental and behavioral disorders: Secondary | ICD-10-CM | POA: Insufficient documentation

## 2013-07-05 MED ORDER — LEVOTHYROXINE SODIUM 75 MCG PO TABS: 75.0000 ug | ORAL_TABLET | Freq: Every day | ORAL | Status: DC

## 2013-07-05 NOTE — ED Notes (Addendum)
Patient reports that he has been out of thyroid medicine and bi-polar medicatons x 5 days. Patient is requesting medication refill.

## 2013-07-05 NOTE — Discharge Instructions (Signed)
Take the prescribed medication as directed. Follow-up with one of the behavior health clinics to establish care with new psychiatrist. Return to the ED for new or worsening symptoms.
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
## 2013-07-05 NOTE — ED Provider Notes (Signed)
Medical screening examination/treatment/procedure(s) were performed by non-physician practitioner and as supervising physician I was immediately available for consultation/collaboration.   EKG Interpretation None       Marcial Pless R. Kaina Orengo, MD 07/05/13 2348 

## 2013-08-03 ENCOUNTER — Emergency Department (HOSPITAL_COMMUNITY)
Admission: EM | Admit: 2013-08-03 | Discharge: 2013-08-03 | Disposition: A | Discharge status: Home / Self Care | Payer: Medicaid - Out of State | Attending: Emergency Medicine | Admitting: Emergency Medicine

## 2013-08-03 ENCOUNTER — Encounter (HOSPITAL_COMMUNITY): Payer: Self-pay | Admitting: Emergency Medicine

## 2013-08-03 DIAGNOSIS — J029 Acute pharyngitis, unspecified: Secondary | ICD-10-CM | POA: Insufficient documentation

## 2013-08-03 DIAGNOSIS — Z87828 Personal history of other (healed) physical injury and trauma: Secondary | ICD-10-CM | POA: Insufficient documentation

## 2013-08-03 DIAGNOSIS — Z8659 Personal history of other mental and behavioral disorders: Secondary | ICD-10-CM | POA: Insufficient documentation

## 2013-08-03 DIAGNOSIS — Z8739 Personal history of other diseases of the musculoskeletal system and connective tissue: Secondary | ICD-10-CM | POA: Insufficient documentation

## 2013-08-03 DIAGNOSIS — E039 Hypothyroidism, unspecified: Secondary | ICD-10-CM | POA: Insufficient documentation

## 2013-08-03 MED ORDER — LEVOTHYROXINE SODIUM 75 MCG PO TABS: 75.0000 ug | ORAL_TABLET | Freq: Every day | ORAL | Status: DC

## 2013-08-03 NOTE — Progress Notes (Signed)
  CARE MANAGEMENT ED NOTE 08/03/2013  Patient:  Johnathan Cruz,Johnathan Cruz   Account Number:  [id number]  Date Initiated:  08/03/2013  Documentation initiated by:  Radford PaxFERRERO,Sofiya Ezelle  Subjective/Objective Assessment:   Patient presents to Ed with needing a refill of synthroid medication     Subjective/Objective Assessment Detail:     Action/Plan:   Action/Plan Detail:   Anticipated DC Date:  08/03/2013     Status Recommendation to Physician:   Result of Recommendation:    Other ED Services  Consult Working Plan    DC Planning Services  Other  PCP issues    Choice offered to / List presented to:            Status of service:  Completed, signed off  ED Comments:   ED Comments Detail:  EDCM spoke to patient ta bedside.  Patient reports he has Medicaid of [place].  EDCM instructed patient to contact the DSS of [place] in effort to [place].  [facility] provided [facility] with a list of pcps who accept Medicaid [place] in [place]. Patient thankful for resources.  No further EDCM needs at this time.

## 2013-08-03 NOTE — ED Notes (Signed)
Pt states takes one tablet Levothyroxin 75 mcg daily x 5 yrs but ran out of medication yesterday. Pt was being seen by doctor in [place], just recently moved back to [place].
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
## 2013-08-03 NOTE — Discharge Instructions (Signed)

## 2013-08-03 NOTE — ED Notes (Signed)
Pt needs refill of Thyroid medication. Pt from [place].

## 2013-08-03 NOTE — Progress Notes (Signed)
[facility] provided pt with a list of primary care resources. Patient stated that he had Medicaid in [place]. CL instructed patient on how to transfer Medicaid from [place] to [place].

## 2013-08-04 NOTE — ED Provider Notes (Signed)
Medical screening examination/treatment/procedure(s) were performed by non-physician practitioner and as supervising physician I was immediately available for consultation/collaboration.   Taetum Flewellen T Destane Speas, MD 08/04/13 2323 

## 2014-03-17 ENCOUNTER — Encounter (HOSPITAL_COMMUNITY): Payer: Self-pay | Admitting: *Deleted

## 2014-03-17 ENCOUNTER — Emergency Department (HOSPITAL_COMMUNITY): Payer: Medicaid - Out of State

## 2014-03-17 ENCOUNTER — Emergency Department (HOSPITAL_COMMUNITY)
Admission: EM | Admit: 2014-03-17 | Discharge: 2014-03-17 | Disposition: A | Discharge status: Home / Self Care | Payer: Medicaid - Out of State | Attending: Emergency Medicine | Admitting: Emergency Medicine

## 2014-03-17 DIAGNOSIS — Z8659 Personal history of other mental and behavioral disorders: Secondary | ICD-10-CM | POA: Insufficient documentation

## 2014-03-17 DIAGNOSIS — M62838 Other muscle spasm: Secondary | ICD-10-CM | POA: Insufficient documentation

## 2014-03-17 DIAGNOSIS — Z79899 Other long term (current) drug therapy: Secondary | ICD-10-CM | POA: Insufficient documentation

## 2014-03-17 DIAGNOSIS — E039 Hypothyroidism, unspecified: Secondary | ICD-10-CM | POA: Insufficient documentation

## 2014-03-17 DIAGNOSIS — Z87828 Personal history of other (healed) physical injury and trauma: Secondary | ICD-10-CM | POA: Insufficient documentation

## 2014-03-17 DIAGNOSIS — M958 Other specified acquired deformities of musculoskeletal system: Secondary | ICD-10-CM | POA: Insufficient documentation

## 2014-03-17 DIAGNOSIS — M25511 Pain in right shoulder: Secondary | ICD-10-CM

## 2014-03-17 MED ORDER — TRAMADOL HCL 50 MG PO TABS: 50.0000 mg | ORAL_TABLET | Freq: Four times a day (QID) | ORAL | Status: DC | PRN
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
## 2014-03-17 NOTE — Discharge Instructions (Signed)
Take the prescribed medication as directed. Follow-up with orthopedics for any worsening symptoms. Return to the ED for new concerns.

## 2014-03-17 NOTE — ED Notes (Signed)
Pt reports helping his friend move his furniture tonight.  Pt reports pain in his R upper back, swollen area noted.  Pt able to move his arms without difficulty at this time.

## 2014-04-13 ENCOUNTER — Encounter (HOSPITAL_COMMUNITY): Payer: Self-pay | Admitting: Emergency Medicine

## 2014-04-13 ENCOUNTER — Emergency Department (HOSPITAL_COMMUNITY): Payer: Medicaid - Out of State

## 2014-04-13 ENCOUNTER — Emergency Department (HOSPITAL_COMMUNITY)
Admission: EM | Admit: 2014-04-13 | Discharge: 2014-04-13 | Disposition: A | Discharge status: Home / Self Care | Payer: Medicaid - Out of State | Attending: Emergency Medicine | Admitting: Emergency Medicine

## 2014-04-13 DIAGNOSIS — M21821 Other specified acquired deformities of right upper arm: Secondary | ICD-10-CM | POA: Insufficient documentation

## 2014-04-13 DIAGNOSIS — E039 Hypothyroidism, unspecified: Secondary | ICD-10-CM | POA: Diagnosis not present

## 2014-04-13 DIAGNOSIS — Z87828 Personal history of other (healed) physical injury and trauma: Secondary | ICD-10-CM | POA: Diagnosis not present

## 2014-04-13 DIAGNOSIS — F319 Bipolar disorder, unspecified: Secondary | ICD-10-CM | POA: Insufficient documentation

## 2014-04-13 DIAGNOSIS — M25511 Pain in right shoulder: Secondary | ICD-10-CM | POA: Insufficient documentation

## 2014-04-13 DIAGNOSIS — R52 Pain, unspecified: Secondary | ICD-10-CM

## 2014-04-13 DIAGNOSIS — Z79899 Other long term (current) drug therapy: Secondary | ICD-10-CM | POA: Insufficient documentation

## 2014-04-13 DIAGNOSIS — M542 Cervicalgia: Secondary | ICD-10-CM | POA: Diagnosis not present

## 2014-04-13 MED ORDER — OXYCODONE-ACETAMINOPHEN 5-325 MG PO TABS: 1.0000 | ORAL_TABLET | Freq: Once | ORAL | Status: DC

## 2014-04-13 MED ORDER — IBUPROFEN 800 MG PO TABS: 800.0000 mg | ORAL_TABLET | Freq: Three times a day (TID) | ORAL | Status: DC

## 2014-04-13 MED ORDER — TRAMADOL HCL 50 MG PO TABS: 50.0000 mg | ORAL_TABLET | Freq: Four times a day (QID) | ORAL | Status: AC | PRN

## 2014-04-13 NOTE — ED Notes (Signed)
Pt is c/o right shoulder pain  Pt denies injury  Pt states it has been bothering him for a couple of days and he is unable to sleep due to the pain
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
## 2014-04-13 NOTE — Discharge Instructions (Signed)
Please follow up closely with an orthopedic doctor for further evaluation of your shoulder pain to ensure no evidence of rotator cuff injury or other cause of your shoulder pain.   Shoulder Pain The shoulder is the joint that connects your arms to your body. The bones that form the shoulder joint include the upper arm bone (humerus), the shoulder blade (scapula), and the collarbone (clavicle). The top of the humerus is shaped like a ball and fits into a rather flat socket on the scapula (glenoid cavity). A combination of muscles and strong, fibrous tissues that connect muscles to bones (tendons) support your shoulder joint and hold the ball in the socket. Small, fluid-filled sacs (bursae) are located in different areas of the joint. They act as cushions between the bones and the overlying soft tissues and help reduce friction between the gliding tendons and the bone as you move your arm. Your shoulder joint allows a wide range of motion in your arm. This range of motion allows you to do things like scratch your back or throw a ball. However, this range of motion also makes your shoulder more prone to pain from overuse and injury. Causes of shoulder pain can originate from both injury and overuse and usually can be grouped in the following four categories:  Redness, swelling, and pain (inflammation) of the tendon (tendinitis) or the bursae (bursitis).  Instability, such as a dislocation of the joint.  Inflammation of the joint (arthritis).  Broken bone (fracture). HOME CARE INSTRUCTIONS   Apply ice to the sore area.  Put ice in a plastic bag.  Place a towel between your skin and the bag.  Leave the ice on for 15-20 minutes, 3-4 times per day for the first 2 days, or as directed by your health care provider.  Stop using cold packs if they do not help with the pain.  If you have a shoulder sling or immobilizer, wear it as long as your caregiver instructs. Only remove it to shower or bathe.  Move your arm as little as possible, but keep your hand moving to prevent swelling.  Squeeze a soft ball or foam pad as much as possible to help prevent swelling.  Only take over-the-counter or prescription medicines for pain, discomfort, or fever as directed by your caregiver. SEEK MEDICAL CARE IF:   Your shoulder pain increases, or new pain develops in your arm, hand, or fingers.  Your hand or fingers become cold and numb.  Your pain is not relieved with medicines. SEEK IMMEDIATE MEDICAL CARE IF:   Your arm, hand, or fingers are numb or tingling.  Your arm, hand, or fingers are significantly swollen or turn white or blue. MAKE SURE YOU:   Understand these instructions.  Will watch your condition.  Will get help right away if you are not doing well or get worse. Document Released: 12/19/2004 Document Revised: 07/26/2013 Document Reviewed: 02/23/2011 [facility] Patient Information 2015 [employer] [place]. This information is not intended to replace advice given to you by your health care provider. Make sure you discuss any questions you have with your health care provider.

## 2014-09-05 ENCOUNTER — Encounter (HOSPITAL_COMMUNITY): Payer: Self-pay | Admitting: Family Medicine

## 2014-09-05 ENCOUNTER — Emergency Department (HOSPITAL_COMMUNITY)
Admission: EM | Admit: 2014-09-05 | Discharge: 2014-09-05 | Disposition: A | Discharge status: Home / Self Care | Payer: Medicaid - Out of State | Attending: Emergency Medicine | Admitting: Emergency Medicine

## 2014-09-05 DIAGNOSIS — Z79899 Other long term (current) drug therapy: Secondary | ICD-10-CM | POA: Diagnosis not present

## 2014-09-05 DIAGNOSIS — M25511 Pain in right shoulder: Secondary | ICD-10-CM | POA: Diagnosis not present

## 2014-09-05 DIAGNOSIS — Z87828 Personal history of other (healed) physical injury and trauma: Secondary | ICD-10-CM | POA: Diagnosis not present

## 2014-09-05 DIAGNOSIS — F319 Bipolar disorder, unspecified: Secondary | ICD-10-CM | POA: Insufficient documentation

## 2014-09-05 DIAGNOSIS — J309 Allergic rhinitis, unspecified: Secondary | ICD-10-CM

## 2014-09-05 DIAGNOSIS — R0981 Nasal congestion: Secondary | ICD-10-CM | POA: Diagnosis present

## 2014-09-05 DIAGNOSIS — E039 Hypothyroidism, unspecified: Secondary | ICD-10-CM | POA: Insufficient documentation

## 2014-09-05 MED ORDER — CETIRIZINE HCL 10 MG PO TABS: 10.0000 mg | ORAL_TABLET | Freq: Every day | ORAL | Status: AC

## 2014-09-05 MED ORDER — NAPROXEN 250 MG PO TABS: 250.0000 mg | ORAL_TABLET | Freq: Two times a day (BID) | ORAL | Status: DC

## 2014-09-05 NOTE — ED Notes (Signed)
Pt verbalizes understanding of d/c instructions and denies any further needs at this time. 

## 2014-09-05 NOTE — ED Notes (Signed)
Pt here for nasal congestion, sneezing. sts also right shoulder pain and swelling.
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
## 2014-09-05 NOTE — Discharge Instructions (Signed)
Allergies °Allergies may happen from anything your body is sensitive to. This may be food, medicines, pollens, chemicals, and nearly anything around you in everyday life that produces allergens. An allergen is anything that causes an allergy producing substance. Heredity is often a factor in causing these problems. This means you may have some of the same allergies as your parents. °Food allergies happen in all age groups. Food allergies are some of the most severe and life threatening. Some common food allergies are cow's milk, seafood, eggs, nuts, wheat, and soybeans. °SYMPTOMS  °· Swelling around the mouth. °· An itchy red rash or hives. °· Vomiting or diarrhea. °· Difficulty breathing. °SEVERE ALLERGIC REACTIONS ARE LIFE-THREATENING. °This reaction is called anaphylaxis. It can cause the mouth and throat to swell and cause difficulty with breathing and swallowing. In severe reactions only a trace amount of food (for example, peanut oil in a salad) may cause death within seconds. °Seasonal allergies occur in all age groups. These are seasonal because they usually occur during the same season every year. They may be a reaction to molds, grass pollens, or tree pollens. Other causes of problems are house dust mite allergens, pet dander, and mold spores. The symptoms often consist of nasal congestion, a runny itchy nose associated with sneezing, and tearing itchy eyes. There is often an associated itching of the mouth and ears. The problems happen when you come in contact with pollens and other allergens. Allergens are the particles in the air that the body reacts to with an allergic reaction. This causes you to release allergic antibodies. Through a chain of events, these eventually cause you to release histamine into the blood stream. Although it is meant to be protective to the body, it is this release that causes your discomfort. This is why you were given anti-histamines to feel better.  If you are unable to  pinpoint the offending allergen, it may be determined by skin or blood testing. Allergies cannot be cured but can be controlled with medicine. °Hay fever is a collection of all or some of the seasonal allergy problems. It may often be treated with simple over-the-counter medicine such as diphenhydramine. Take medicine as directed. Do not drink alcohol or drive while taking this medicine. Check with your caregiver or package insert for child dosages. °If these medicines are not effective, there are many new medicines your caregiver can prescribe. Stronger medicine such as nasal spray, eye drops, and corticosteroids may be used if the first things you try do not work well. Other treatments such as immunotherapy or desensitizing injections can be used if all else fails. Follow up with your caregiver if problems continue. These seasonal allergies are usually not life threatening. They are generally more of a nuisance that can often be handled using medicine. °HOME CARE INSTRUCTIONS  °· If unsure what causes a reaction, keep a diary of foods eaten and symptoms that follow. Avoid foods that cause reactions. °· If hives or rash are present: °¨ Take medicine as directed. °¨ You may use an over-the-counter antihistamine (diphenhydramine) for hives and itching as needed. °¨ Apply cold compresses (cloths) to the skin or take baths in cool water. Avoid hot baths or showers. Heat will make a rash and itching worse. °· If you are severely allergic: °¨ Following a treatment for a severe reaction, hospitalization is often required for closer follow-up. °¨ Wear a medic-alert bracelet or necklace stating the allergy. °¨ You and your family must learn how to give adrenaline or use   an anaphylaxis kit.  If you have had a severe reaction, always carry your anaphylaxis kit or EpiPen with you. Use this medicine as directed by your caregiver if a severe reaction is occurring. Failure to do so could have a fatal outcome. SEEK MEDICAL  CARE IF:  You suspect a food allergy. Symptoms generally happen within 30 minutes of eating a food.  Your symptoms have not gone away within 2 days or are getting worse.  You develop new symptoms.  You want to retest yourself or your child with a food or drink you think causes an allergic reaction. Never do this if an anaphylactic reaction to that food or drink has happened before. Only do this under the care of a caregiver. SEEK IMMEDIATE MEDICAL CARE IF:   You have difficulty breathing, are wheezing, or have a tight feeling in your chest or throat.  You have a swollen mouth, or you have hives, swelling, or itching all over your body.  You have had a severe reaction that has responded to your anaphylaxis kit or an EpiPen. These reactions may return when the medicine has worn off. These reactions should be considered life threatening. MAKE SURE YOU:   Understand these instructions.  Will watch your condition.  Will get help right away if you are not doing well or get worse. Document Released: 06/04/2002 Document Revised: 07/06/2012 Document Reviewed: 11/09/2007 [facility] Patient Information 2015 [place], [place]. This information is not intended to replace advice given to you by your health care provider. Make sure you discuss any questions you have with your health care provider.  Allergic Rhinitis Allergic rhinitis is when the mucous membranes in the nose respond to allergens. Allergens are particles in the air that cause your body to have an allergic reaction. This causes you to release allergic antibodies. Through a chain of events, these eventually cause you to release histamine into the blood stream. Although meant to protect the body, it is this release of histamine that causes your discomfort, such as frequent sneezing, congestion, and an itchy, runny nose.  CAUSES  Seasonal allergic rhinitis (hay fever) is caused by pollen allergens that may come from grasses, trees, and weeds.  Year-round allergic rhinitis (perennial allergic rhinitis) is caused by allergens such as house dust mites, pet dander, and mold spores.  SYMPTOMS   Nasal stuffiness (congestion).  Itchy, runny nose with sneezing and tearing of the eyes. DIAGNOSIS  Your health care provider can help you determine the allergen or allergens that trigger your symptoms. If you and your health care provider are unable to determine the allergen, skin or blood testing may be used. TREATMENT  Allergic rhinitis does not have a cure, but it can be controlled by:  Medicines and allergy shots (immunotherapy).  Avoiding the allergen. Hay fever may often be treated with antihistamines in pill or nasal spray forms. Antihistamines block the effects of histamine. There are over-the-counter medicines that may help with nasal congestion and swelling around the eyes. Check with your health care provider before taking or giving this medicine.  If avoiding the allergen or the medicine prescribed do not work, there are many new medicines your health care provider can prescribe. Stronger medicine may be used if initial measures are ineffective. Desensitizing injections can be used if medicine and avoidance does not work. Desensitization is when a patient is given ongoing shots until the body becomes less sensitive to the allergen. Make sure you follow up with your health care provider if problems continue. HOME  CARE INSTRUCTIONS It is not possible to completely avoid allergens, but you can reduce your symptoms by taking steps to limit your exposure to them. It helps to know exactly what you are allergic to so that you can avoid your specific triggers. SEEK MEDICAL CARE IF:   You have a fever.  You develop a cough that does not stop easily (persistent).  You have shortness of breath.  You start wheezing.  Symptoms interfere with normal daily activities. Document Released: 12/04/2000 Document Revised: 03/16/2013 Document  Reviewed: 11/16/2012 [facility] Patient Information 2015 [place], [place]. This information is not intended to replace advice given to you by your health care provider. Make sure you discuss any questions you have with your health care provider. Shoulder Pain The shoulder is the joint that connects your arms to your body. The bones that form the shoulder joint include the upper arm bone (humerus), the shoulder blade (scapula), and the collarbone (clavicle). The top of the humerus is shaped like a ball and fits into a rather flat socket on the scapula (glenoid cavity). A combination of muscles and strong, fibrous tissues that connect muscles to bones (tendons) support your shoulder joint and hold the ball in the socket. Small, fluid-filled sacs (bursae) are located in different areas of the joint. They act as cushions between the bones and the overlying soft tissues and help reduce friction between the gliding tendons and the bone as you move your arm. Your shoulder joint allows a wide range of motion in your arm. This range of motion allows you to do things like scratch your back or throw a ball. However, this range of motion also makes your shoulder more prone to pain from overuse and injury. Causes of shoulder pain can originate from both injury and overuse and usually can be grouped in the following four categories:  Redness, swelling, and pain (inflammation) of the tendon (tendinitis) or the bursae (bursitis).  Instability, such as a dislocation of the joint.  Inflammation of the joint (arthritis).  Broken bone (fracture). HOME CARE INSTRUCTIONS   Apply ice to the sore area.  Put ice in a plastic bag.  Place a towel between your skin and the bag.  Leave the ice on for 15-20 minutes, 3-4 times per day for the first 2 days, or as directed by your health care provider.  Stop using cold packs if they do not help with the pain.  If you have a shoulder sling or immobilizer, wear it as long as  your caregiver instructs. Only remove it to shower or bathe. Move your arm as little as possible, but keep your hand moving to prevent swelling.  Squeeze a soft ball or foam pad as much as possible to help prevent swelling.  Only take over-the-counter or prescription medicines for pain, discomfort, or fever as directed by your caregiver. SEEK MEDICAL CARE IF:   Your shoulder pain increases, or new pain develops in your arm, hand, or fingers.  Your hand or fingers become cold and numb.  Your pain is not relieved with medicines. SEEK IMMEDIATE MEDICAL CARE IF:   Your arm, hand, or fingers are numb or tingling.  Your arm, hand, or fingers are significantly swollen or turn white or blue. MAKE SURE YOU:   Understand these instructions.  Will watch your condition.  Will get help right away if you are not doing well or get worse. Document Released: 12/19/2004 Document Revised: 07/26/2013 Document Reviewed: 02/23/2011 [facility] Patient Information 2015 [place], [place]. This information is  not intended to replace advice given to you by your health care provider. Make sure you discuss any questions you have with your health care provider.

## 2014-09-28 ENCOUNTER — Encounter (HOSPITAL_COMMUNITY): Payer: Self-pay | Admitting: Emergency Medicine

## 2014-09-28 ENCOUNTER — Emergency Department (HOSPITAL_COMMUNITY)
Admission: EM | Admit: 2014-09-28 | Discharge: 2014-09-28 | Disposition: A | Discharge status: Home / Self Care | Payer: Medicaid - Out of State | Attending: Emergency Medicine | Admitting: Emergency Medicine

## 2014-09-28 DIAGNOSIS — Z8739 Personal history of other diseases of the musculoskeletal system and connective tissue: Secondary | ICD-10-CM | POA: Insufficient documentation

## 2014-09-28 DIAGNOSIS — Z760 Encounter for issue of repeat prescription: Secondary | ICD-10-CM

## 2014-09-28 DIAGNOSIS — E039 Hypothyroidism, unspecified: Secondary | ICD-10-CM | POA: Diagnosis not present

## 2014-09-28 DIAGNOSIS — Z791 Long term (current) use of non-steroidal anti-inflammatories (NSAID): Secondary | ICD-10-CM | POA: Diagnosis not present

## 2014-09-28 DIAGNOSIS — F319 Bipolar disorder, unspecified: Secondary | ICD-10-CM | POA: Insufficient documentation

## 2014-09-28 DIAGNOSIS — Z87828 Personal history of other (healed) physical injury and trauma: Secondary | ICD-10-CM | POA: Diagnosis not present

## 2014-09-28 DIAGNOSIS — Z79899 Other long term (current) drug therapy: Secondary | ICD-10-CM | POA: Insufficient documentation

## 2014-09-28 MED ORDER — LEVOTHYROXINE SODIUM 75 MCG PO TABS: 75.0000 ug | ORAL_TABLET | Freq: Every day | ORAL | Status: DC

## 2014-09-28 NOTE — ED Notes (Signed)
Pt requests 75 mg levothyroxine prescription. Pt states that he had prescription in [place] but that he does not have it here in [place], pt states he came from [place] this morning.
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
## 2014-09-28 NOTE — Discharge Instructions (Signed)
Medication Refill, Emergency Department °We have refilled your medication today as a courtesy to you. It is best for your medical care, however, to take care of getting refills done through your primary caregiver's office. They have your records and can do a better job of follow-up than we can in the emergency department. °On maintenance medications, we often only prescribe enough medications to get you by until you are able to see your regular caregiver. This is a more expensive way to refill medications. °In the future, please plan for refills so that you will not have to use the emergency department for this. °Thank you for your help. Your help allows us to better take care of the daily emergencies that enter our department. °Document Released: 06/28/2003 Document Revised: 06/03/2011 Document Reviewed: 06/18/2013 °ExitCare® Patient Information ©2015 [employer]. This information is not intended to replace advice given to you by your health care provider. Make sure you discuss any questions you have with your health care provider. ° °

## 2014-09-28 NOTE — ED Notes (Signed)
Pt escorted to discharge window. Pt verbalized understanding discharge instructions. In no acute distress.  

## 2014-11-11 ENCOUNTER — Emergency Department (HOSPITAL_COMMUNITY)
Admission: EM | Admit: 2014-11-11 | Discharge: 2014-11-11 | Disposition: A | Discharge status: Home / Self Care | Payer: Medicaid - Out of State | Attending: Emergency Medicine | Admitting: Emergency Medicine

## 2014-11-11 ENCOUNTER — Encounter (HOSPITAL_COMMUNITY): Payer: Self-pay | Admitting: Emergency Medicine

## 2014-11-11 DIAGNOSIS — Z8739 Personal history of other diseases of the musculoskeletal system and connective tissue: Secondary | ICD-10-CM | POA: Diagnosis not present

## 2014-11-11 DIAGNOSIS — Z791 Long term (current) use of non-steroidal anti-inflammatories (NSAID): Secondary | ICD-10-CM | POA: Insufficient documentation

## 2014-11-11 DIAGNOSIS — Z79899 Other long term (current) drug therapy: Secondary | ICD-10-CM | POA: Diagnosis not present

## 2014-11-11 DIAGNOSIS — Z87828 Personal history of other (healed) physical injury and trauma: Secondary | ICD-10-CM | POA: Insufficient documentation

## 2014-11-11 DIAGNOSIS — F319 Bipolar disorder, unspecified: Secondary | ICD-10-CM | POA: Diagnosis not present

## 2014-11-11 DIAGNOSIS — R202 Paresthesia of skin: Secondary | ICD-10-CM | POA: Diagnosis not present

## 2014-11-11 DIAGNOSIS — E039 Hypothyroidism, unspecified: Secondary | ICD-10-CM | POA: Diagnosis not present

## 2014-11-11 DIAGNOSIS — R200 Anesthesia of skin: Secondary | ICD-10-CM | POA: Diagnosis present

## 2014-11-11 NOTE — Discharge Instructions (Signed)
Paresthesia Mr. Johnathan Cruz, see a neurologist within 3 days for close follow up.  If symptoms worsen come back to the ED immediately.  Thank you. Paresthesia is a burning or prickling feeling. This feeling can happen in any part of the body. It often happens in the hands, arms, legs, or feet. HOME CARE  Avoid drinking alcohol.  Try massage or needle therapy (acupuncture) to help with your problems.  Keep all doctor visits as told. GET HELP RIGHT AWAY IF:   You feel weak.  You have trouble walking or moving.  You have problems speaking or seeing.  You feel confused.  You cannot control when you poop (bowel movement) or pee (urinate).  You lose feeling (numbness) after an injury.  You pass out (faint).  Your burning or prickling feeling gets worse when you walk.  You have pain, cramps, or feel dizzy.  You have a rash. MAKE SURE YOU:   Understand these instructions.  Will watch your condition.  Will get help right away if you are not doing well or get worse. Document Released: 02/22/2008 Document Revised: 06/03/2011 Document Reviewed: 11/30/2010 [facility] Patient Information 2015 [place], [place]. This information is not intended to replace advice given to you by your health care provider. Make sure you discuss any questions you have with your health care provider.

## 2014-11-11 NOTE — ED Notes (Signed)
Pt arrived to teh ED with a complaint of arm numbness.  Pt states he feels that his circulation is not correct.  Pt feels fatigue and weakness. Pt has been experiencing this sensation for 6 weeks.
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
## 2014-11-28 ENCOUNTER — Emergency Department (HOSPITAL_COMMUNITY)
Admission: EM | Admit: 2014-11-28 | Discharge: 2014-11-28 | Disposition: A | Discharge status: Home / Self Care | Payer: Medicaid - Out of State | Attending: Emergency Medicine | Admitting: Emergency Medicine

## 2014-11-28 ENCOUNTER — Encounter (HOSPITAL_COMMUNITY): Payer: Self-pay | Admitting: *Deleted

## 2014-11-28 DIAGNOSIS — Z760 Encounter for issue of repeat prescription: Secondary | ICD-10-CM | POA: Diagnosis not present

## 2014-11-28 DIAGNOSIS — R531 Weakness: Secondary | ICD-10-CM | POA: Insufficient documentation

## 2014-11-28 DIAGNOSIS — J029 Acute pharyngitis, unspecified: Secondary | ICD-10-CM | POA: Insufficient documentation

## 2014-11-28 DIAGNOSIS — E039 Hypothyroidism, unspecified: Secondary | ICD-10-CM | POA: Diagnosis not present

## 2014-11-28 DIAGNOSIS — Z79899 Other long term (current) drug therapy: Secondary | ICD-10-CM | POA: Diagnosis not present

## 2014-11-28 DIAGNOSIS — F319 Bipolar disorder, unspecified: Secondary | ICD-10-CM | POA: Diagnosis not present

## 2014-11-28 LAB — RAPID STREP SCREEN (MED CTR MEBANE ONLY): STREPTOCOCCUS, GROUP A SCREEN (DIRECT): NEGATIVE

## 2014-11-28 MED ORDER — LEVOTHYROXINE SODIUM 75 MCG PO TABS: 75.0000 ug | ORAL_TABLET | Freq: Every day | ORAL | Status: DC

## 2014-11-28 NOTE — Discharge Instructions (Signed)
Ibuprofen, Tylenol for your sore throat. Salt water gargles.Continue Synthroid as prescribed. Follow with your primary care doctor. See resource guide below.    Emergency Department Resource Guide 1) Find a Doctor and Pay Out of Pocket Although you won't have to find out who is covered by your insurance plan, it is a good idea to ask around and get recommendations. You will then need to call the office and see if the doctor you have chosen will accept you as a new patient and what types of options they offer for patients who are self-pay. Some doctors offer discounts or will set up payment plans for their patients who do not have insurance, but you will need to ask so you aren't surprised when you get to your appointment.  2) Contact Your Local Health Department Not all health departments have doctors that can see patients for sick visits, but many do, so it is worth a call to see if yours does. If you don't know where your local health department is, you can check in your phone book. The CDC also has a tool to help you locate your state's health department, and many state websites also have listings of all of their local health departments.  3) Find a Walk-in Clinic If your illness is not likely to be very severe or complicated, you may want to try a walk in clinic. These are popping up all over the country in pharmacies, drugstores, and shopping centers. They're usually staffed by nurse practitioners or physician assistants that have been trained to treat common illnesses and complaints. They're usually fairly quick and inexpensive. However, if you have serious medical issues or chronic medical problems, these are probably not your best option.  No Primary Care Doctor: - Call Health Connect at  [phone] - they can help you locate a primary care doctor that  accepts your insurance, provides certain services, etc. - Physician Referral Service- [phone]  Chronic Pain Problems: Organization          Address  Phone   Notes  [facility]  [phone] Patients need to be referred by their primary care doctor.   Medication Assistance: Organization         Address  Phone   Notes  [facility] [address] [place] [place], [place] [address] [phone] --Must be a resident of [facility] -- Must have NO insurance coverage whatsoever (no Medicaid/ Medicare, etc.) -- The pt. MUST have a primary care doctor that directs their care regularly and follows them in the community   [facility]  [phone]   [employer]  [phone]    Agencies that provide inexpensive medical care: Organization         Address  Phone   Notes  [facility]  [phone]   [facility]    [phone]   [facility] [address] [place], [place] [address] [phone]   [facility] [address] [place] [place] [phone]   [facility]anned Parenthood    [phone]   [facility]    [phone]   [facility]  [address],  Phone:  [phone], Fax:  [phone] Hours of Operation:  9 am - 6 pm, M-F.  Also accepts Medicaid/Medicare and self-pay.  [facility]  [address], [place] Phone: [phone])  [phone], Fax: [phone]. Hours of Operation:  8:30 am - 5:30 pm, M-F.  Also accepts Medicaid and self-pay.  °[facility] [place] [address], [place] Phone: [phone]   °[facility] [address], [place], [place] [phone], Ext. 123 Mondays & Thursdays: 7-9 AM.  First 15 patients are seen on a first come, first serve basis. °  ° °Medicaid-accepting [place] Providers: ° °Organization         Address  Phone   Notes  °[facility] [address], [place] [phone] Also accepts self-pay patients.    °[facility] [address], [place] ° ([phone]) [phone]   °[facility] [address], [place] [phone]) [phone]   °[facility] [address] [place] [address], [place] [phone]) [phone]   °Veita Bland [address], [place]  ° [phone]) [phone] Only accepts [place] Access Medicaid patients after they have their name applied to their card.  ° °Self-Pay (no insurance) in [place]: ° °Organization         Address  Phone   Notes  °Sickle Cell Patients, [facility] [address], [place] [phone]) [phone]   °[facility] [address], [place] [phone]) [phone]   °[facility] [place] ° [address], [place] ([phone]) [phone]   °[facility]/Dr. Osei-Bonsu ° [address] [place] [address], [place] or [address], [place] [phone] Phone number for both [place] and [place] locations is the same.  °[facility] [address], [place] ([phone]) [phone]   °[facility] [address] [place] [address], [place] or [address] ([phone]) [phone] °([phone]) [phone]   °[facility] [address], [place] ([phone]) [phone], phone; ([phone], fax Sees patients 1st and 3rd Saturday of every month.  Must not qualify for public or private insurance (i.e. Medicaid, Medicare, [place] Health Choice, Veterans' Benefits) • Household income should be no more than 200% of the poverty level •The clinic cannot treat you if you are pregnant or think you are pregnant • Sexually transmitted diseases are not treated at the clinic.  ° ° °Dental Care: °Organization         Address  Phone  Notes  °[place] [facility] [address], [place] ([phone]) [phone] Accepts children up to age 21 who are enrolled in Medicaid or [place] Health Choice; pregnant women with a Medicaid  card; and children who have applied for Medicaid or [place] Health Choice, but were declined, whose parents can pay a reduced fee at time of service.  °[place] [facility] [place]  [address], [place] ([phone]) [phone] Accepts children up to age 21 who are enrolled in Medicaid or [place] Health Choice; pregnant women with a Medicaid card; and children who have applied for Medicaid or  Health Choice, but were declined, whose parents can pay a reduced fee at time of service.  °[facility] ° [address],  ([phone]) [phone] Patients are seen by appointment only. Walk-ins are not accepted. [facility] will see patients 18 years of age and older. °Monday - Tuesday (8am-5pm) °Most Wednesdays (8:30-5pm) °$30 per visit, cash only  °[facility] ° [address]   [place] [phone] Patients are seen by appointment only. Walk-ins are not accepted. [facility] will see patients 44 years of age and older. One Wednesday Evening (Monthly: Volunteer Based).  $30 per visit, cash only  [employer] [facility]  [phone] for adults; Children under age 364, call Graduate Pediatric Dentistry at [phone]. Children aged 134-14, please call [phone] to request a pediatric application.  Dental services are provided in all areas of dental care including fillings, crowns and bridges, complete and partial dentures, implants, gum treatment, root canals, and extractions. Preventive care is also provided. Treatment is provided to both adults and children. Patients are selected via a lottery and there is often a waiting list.   [facility] [address], [place]  [phone] [email or web address]   [facility] [address], [place], [place] [phone], Ext. [phone] Second and Fourth Thursday of each month, opens at 6:30 AM; Clinic ends at 9 AM.  Patients are seen on a first-come  first-served basis, and a limited number are seen during each clinic.   [facility]  [address], [place], [place] [phone]) [phone]   Eligibility Requirements You must have lived in [place], [place], or [place] counties for at least the last three months.   You cannot be eligible for state or federal sponsored [place]care insurance, including [place]IG[place]eterans Administration, [place], or [employer]icare.   You generally cannot be eligible for healthcare insurance through your employer.    How to apply: Eligibility screenings are held every Tuesday and Wednesday afternoon from 1:00 pm until 4:00 pm. You do not need an appointment for the interview!  [facility] [address], [place], [place] [phone]   [facility]  [phone]   [facility]  [phone]   [facility]  [phone]    Behavioral Health Resources in the Community: Intensive Outpatient Programs Organization         Address  Phone  Notes  [facility] [address], [place], [place] [phone]   [facility] [address], [place], [place] [phone]   [facility] [address], [place], [place]  [phone]   [facility] [address],  [place], [place] [phone] or [phone]   Substance Abuse Resources Organization         Address  Phone  Notes  [facility]  [phone]   [facility]  [phone]   [facility]  [phone]   [facility]  [phone]   Residential & Outpatient Substance Abuse Program  [phone]   Psychological Services Organization         Address  Phone  Notes  [facility]  [phone]- [phone]   [facility]  [phone]- [phone]   [facility] [address], [place] [phone] or [phone]    Mobile Crisis Teams Organization          Address  Phone  Notes  [facility], Mobile Crisis Care Unit  [phone]   [facility]  [address] [place], [place] [phone]   Doristine LocksSharon DeEsch [address] [place] [phone]    Self-Help/Support Groups Organization         Address  Phone             Notes  [facility] [place] - variety of support  groups  [phone] Call for more information  Narcotics Anonymous (NA), [facility] [address], [employer] [place]  2 meetings at this location   Residential Sports administrator         Address  Phone  Notes  [facility] [address],    [place]  [phone]   [facility]  [address], [place] [address], [place], [place] [phone]   [facility] [address] [place], [place] [phone] Admissions: 8am-3pm M-F  [facility] [address],    [place], [place] [phone]   [facility] [address] [place], [place], [place] [phone]   [facility] [address]  [place], [place] [phone]   [facility] - Intensive Outpatient [address], [place], [place] [phone]   [facility] ([facility] [address] [place].,  [place], [place] [phone] or [phone]   [facility] ([facility]) [address] [place], [place] [phone] Accepts Medicaid  [facility] [address],  [place] [phone] Substance Abuse/Addiction Treatment   [facility] Organization         Address  Phone  Notes  [facility]  [phone]   Angie Fava, PhD [address] [place], [place]   [phone] or [phone]   [facility]   [address] [place], [place] [phone]   [facility] [address], [place], [place] [phone] Insurance/Medicaid/sponsorship  through [facility] and Families [address]                                    [place], [place] [phone] Therapy/tele-psych/case  [facility] [address], [place] [phone]    Dr. Lolly Mustache  [phone]   [facility]  [employer] [facility] Dept. 1) [address], [place] 2) [address], [place] 3)  [address], [place] [phone]  [phone]   [facility] Child Abuse Hotline [phone] or [phone] (After Hours)
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
## 2014-11-28 NOTE — ED Notes (Signed)
Pt is ambulatory and a&ox4. Pt denied questions, concerns r/t dc

## 2014-11-28 NOTE — ED Notes (Signed)
Pt reports hx of hypothyroidism, pt has pcp in [place], reports he has had medication refilled in ED before, reports he is out of thyroid medicine. Also reports sore throat and cough x5 days. Pain 10/10. Reports body feels fatigued.

## 2014-12-01 LAB — CULTURE, GROUP A STREP: Strep A Culture: NEGATIVE

## 2015-01-28 ENCOUNTER — Encounter (HOSPITAL_COMMUNITY): Payer: Self-pay

## 2015-01-28 ENCOUNTER — Emergency Department (HOSPITAL_COMMUNITY)
Admission: EM | Admit: 2015-01-28 | Discharge: 2015-01-28 | Disposition: A | Discharge status: Home / Self Care | Payer: Medicaid - Out of State | Attending: Emergency Medicine | Admitting: Emergency Medicine

## 2015-01-28 DIAGNOSIS — Z87828 Personal history of other (healed) physical injury and trauma: Secondary | ICD-10-CM | POA: Diagnosis not present

## 2015-01-28 DIAGNOSIS — Z760 Encounter for issue of repeat prescription: Secondary | ICD-10-CM

## 2015-01-28 DIAGNOSIS — Z8659 Personal history of other mental and behavioral disorders: Secondary | ICD-10-CM | POA: Diagnosis not present

## 2015-01-28 DIAGNOSIS — Z79899 Other long term (current) drug therapy: Secondary | ICD-10-CM | POA: Insufficient documentation

## 2015-01-28 DIAGNOSIS — E039 Hypothyroidism, unspecified: Secondary | ICD-10-CM | POA: Insufficient documentation

## 2015-01-28 MED ORDER — LEVOTHYROXINE SODIUM 75 MCG PO TABS: 75.0000 ug | ORAL_TABLET | Freq: Every day | ORAL | Status: DC
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
## 2015-01-28 NOTE — ED Notes (Signed)
He is here requesting a prescription for his Synthroid 75 mcg.  He has no somatic complaints/problem(s).

## 2015-01-28 NOTE — Discharge Instructions (Signed)
1. Medications: usual home medications 2. Treatment: rest, drink plenty of fluids 3. Follow Up: please followup with your primary doctor for discussion of your diagnoses and further evaluation after today's visit; if you do not have a primary care doctor use the resource guide provided to find one; please return to the ER for new or worsening symptoms   Medicine Refill at the Emergency Department We have refilled your medicine today, but it is best for you to get refills through your primary health care provider's office. In the future, please plan ahead so you do not need to get refills from the emergency department. If the medicine we refilled was a maintenance medicine, you may have received only enough to get you by until you are able to see your regular health care provider.   This information is not intended to replace advice given to you by your health care provider. Make sure you discuss any questions you have with your health care provider.   Document Released: 06/28/2003 Document Revised: 04/01/2014 Document Reviewed: 06/18/2013 Elsevier Interactive Patient Education 2016 [employer].   Emergency Department Resource Guide 1) Find a Doctor and Pay Out of Pocket Although you won't have to find out who is covered by your insurance plan, it is a good idea to ask around and get recommendations. You will then need to call the office and see if the doctor you have chosen will accept you as a new patient and what types of options they offer for patients who are self-pay. Some doctors offer discounts or will set up payment plans for their patients who do not have insurance, but you will need to ask so you aren't surprised when you get to your appointment.  2) Contact Your Local Health Department Not all health departments have doctors that can see patients for sick visits, but many do, so it is worth a call to see if yours does. If you don't know where your local health department is, you can  check in your phone book. The CDC also has a tool to help you locate your state's health department, and many state websites also have listings of all of their local health departments.  3) Find a Walk-in Clinic If your illness is not likely to be very severe or complicated, you may want to try a walk in clinic. These are popping up all over the country in pharmacies, drugstores, and shopping centers. They're usually staffed by nurse practitioners or physician assistants that have been trained to treat common illnesses and complaints. They're usually fairly quick and inexpensive. However, if you have serious medical issues or chronic medical problems, these are probably not your best option.  No Primary Care Doctor: - Call Health Connect at  [phone] - they can help you locate a primary care doctor that  accepts your insurance, provides certain services, etc. - Physician Referral Service- [phone]  Chronic Pain Problems: Organization         Address  Phone   Notes  [facility]  [phone] Patients need to be referred by their primary care doctor.   Medication Assistance: Organization         Address  Phone   Notes  [facility] Medication [facility] [address] [place], [place] [address] [phone] --Must be a resident of [facility] -- Must have NO insurance coverage whatsoever (no Medicaid/ Medicare, etc.) -- The pt. MUST have a primary care doctor that directs their care  regularly and follows them in the community   [facility]  [phone]   [employer]  [phone]    Agencies that provide inexpensive medical care: Organization         Address  Phone   Notes  [facility]  [phone]   [facility]    [phone]   [facility] [address] [place], [place] [address] [phone]   [facility] [address] [place] [place] [phone]    Planned Parenthood    [phone]   [facility]    [phone]   [facility]  [address], [place] Phone:  [phone], Fax:  [phone] Hours of Operation:  9 am - 6 pm, M-F.  Also accepts Medicaid/Medicare and self-pay.  [facility]  [address], [place] Phone: [phone], Fax: [phone]. Hours of Operation:  8:30 am - 5:30 pm, M-F.  Also accepts Medicaid and self-pay.  [facility] [place] [address] [place] Phone: [phone]   [facility] [address] Natasha Bence [place], [place] [phone], Ext. [phone] Mondays & Thursdays: 7-9 AM.  First 15 patients are seen on a first come, first serve basis.    Medicaid-accepting [facility] Providers:  Organization         Address  Phone   Notes  [facility] [address], [place] [phone] Also accepts self-pay patients.  [facility] [address] Laurell Josephs [place], [place]  [phone]   [facility] [address], [place] [phone]   [facility] [address] [place] [phone]   Renaye Rakers [address], [place]   [phone] Only accepts [place] Access [place] patients after they have their name applied to their card.   Self-Pay (no insurance) in [facility]:  Organization         Address  Phone   Notes  Sickle Cell Patients, [facility] [address] [place], [place] [phone]   [facility] [address] [place], [place] [phone]   [facility] [place]  [address],  [phone]   [facility]/Dr. Osei-Bonsu  [address], [place] or [address], [place] [phone] Phone number for both [place] and [place] locations is the  same.  [facility] [address] [place] [phone]   [facility] [address] [place] or [address] [phone]   [facility] [address] [place] [phone], phone; [phone], fax Sees patients 1st and 3rd Saturday of every month.  Must not qualify for public or private insurance (i.e. Medicaid, Medicare, [place] Health Choice, Veterans' Benefits)  Household income should be no more than 200% of the poverty level The clinic cannot treat you if you are pregnant or think you are pregnant  Sexually transmitted diseases are not treated at the clinic.    Dental Care: Organization         Address  Phone  Notes  [facility] Department of [facility] [address] [place], [place] [phone] Accepts children up to age 40  who are enrolled in Medicaid or [place] Health Choice; pregnant women with a Medicaid card; and children who have applied for Medicaid or [place] Health Choice, but were declined, whose parents can pay a reduced fee at time of service.  [facility] [place]  [address], [place] [phone] Accepts children up to age 44 who are enrolled in [place] or [place] Health Choice; pregnant women with a Medicaid card; and children who have applied for Medicaid or [place] Health Choice, but were declined, whose parents can pay a reduced fee at time of service.  [facility]  [address], [place] [phone] Patients are seen by appointment only. Walk-ins are not accepted. [facility] will see patients 44 years of age and older. Monday - Tuesday (8am-5pm) Most Wednesdays (8:30-5pm) $30 per visit, cash only  [facility]  [address], [facility] [place] [phone] Patients are seen by appointment only. Walk-ins are not accepted. [facility] will see patients  44 years of age and older. One Wednesday Evening (Monthly: Volunteer Based).  $30 per visit, cash only  [employer] [facility] [facility]  [phone] for adults; Children under age 254, call Graduate Pediatric Dentistry at [phone]. Children aged 254-14, please call [phone] to request a pediatric application.  Dental services are provided in all areas of dental care including fillings, crowns and bridges, complete and partial dentures, implants, gum treatment, root canals, and extractions. Preventive care is also provided. Treatment is provided to both adults and children. Patients are selected via a lottery and there is often a waiting list.   [facility] [address], [place]  [phone] [email or web address]   [facility] [address], [place], [place] [phone], Ext. [phone] Second and Fourth Thursday of each month, opens at 6:30 AM; Clinic ends at 9 AM.  Patients are seen on a first-come first-served basis, and a limited number are seen during each clinic.   [facility]  [address], [place], [place] [phone]) [phone]   Eligibility Requirements You must have lived in [place], [place], or [place] counties for at least the last three months.   You cannot be eligible for state or federal sponsored [place]healthcare insurance, including CIGNAVeterans Administration, [place]icaid, or [employer]icare.   You generally cannot be eligible for healthcare insurance through your employer.    How to apply: Eligibility screenings are held every Tuesday and Wednesday afternoon from 1:00 pm until 4:00 pm. You do not need an appointment for the interview!  [facility] [address], [place], [place] [phone]   [facility]  [phone]   [facility]  [phone]   [facility]  [phone]    Behavioral Health Resources in the Community: Intensive Outpatient  Programs Organization         Address  Phone  Notes  [facility] [address], [place], [place] [phone]   [facility] [address], [place], [place] [phone]   [facility] [address], [place], [place]  [phone]   [facility] [address],  [place], [place] [phone] or [phone]   Substance Abuse Resources Organization         Address  Phone  Notes  [facility]  [phone]   [facility]  [phone]   [facility]  [phone]   Floydene Flock  [phone]   Residential & Outpatient Substance Abuse Program  [phone]   Psychological Services Organization         Address  Phone  Notes  [facility]  [phone]   [facility]  [phone]   [facility] [address] [place] [phone] or [phone]    Mobile Crisis Teams Organization         Address  Phone  Notes  [facility], Mobile Crisis Care Unit  [phone]   [facility]  [address] [place], [place] [phone]   Doristine Locks [address] [place] [phone]    Self-Help/Support Groups Organization         Address  Phone             Notes  [facility] [place] - variety of support groups  [phone]- I7437963 Call for more information  Narcotics Anonymous (NA), [facility] [address], [employer] [place]  2 meetings at this location   Statistician         Address  Phone  Notes  [facility] [address],    [place]  [phone]   [facility]  [address], [place] [address], [place], [place] [phone]   [facility] [address] [place], [place] [phone] Admissions: 8am-3pm M-F  [facility] [address],    [place], [place]  [phone]   [facility] [address] [place], [place], [place] [phone]   [facility] [place] [facility] [address] [place] [address],  [place], [place] [phone]   [facility] - Intensive Outpatient [address] Laurell Josephs [address], [place], [place] [phone]   [facility] ([facility] [address] [place]  [place], [place] [phone] or [phone]   [facility] ([facility]) [address] [place], [place] [phone] Accepts Medicaid  [facility] [address]  [place] [phone] Substance Abuse/Addiction Treatment   [facility] Organization         Address  Phone  Notes  [facility]  [phone]   Angie Fava, PhD [address] Ervin Knack [place], [place]   [phone] or [phone]   [facility]   [address] [place], [place] [phone]   [facility] [address], [place], [place] [phone] Insurance/Medicaid/sponsorship through [facility] and Families [address]                                    [place], [place] [phone] Therapy/tele-psych/case  [facility] [address], [place] [phone]    Dr. Lolly Mustache  [phone]   [facility]  [facility]. 1) [address], [place] 2) [address] [place] 3)  [address], [place] [phone]  [phone]   [facility] Child Abuse Hotline [phone] or 4176873758 (After Hours)

## 2015-02-25 ENCOUNTER — Encounter (HOSPITAL_COMMUNITY): Payer: Self-pay | Admitting: Emergency Medicine

## 2015-02-25 ENCOUNTER — Emergency Department (HOSPITAL_COMMUNITY)
Admission: EM | Admit: 2015-02-25 | Discharge: 2015-02-25 | Disposition: A | Discharge status: Home / Self Care | Payer: Medicaid - Out of State | Attending: Emergency Medicine | Admitting: Emergency Medicine

## 2015-02-25 DIAGNOSIS — Z760 Encounter for issue of repeat prescription: Secondary | ICD-10-CM

## 2015-02-25 DIAGNOSIS — F319 Bipolar disorder, unspecified: Secondary | ICD-10-CM | POA: Insufficient documentation

## 2015-02-25 DIAGNOSIS — Z79899 Other long term (current) drug therapy: Secondary | ICD-10-CM | POA: Diagnosis not present

## 2015-02-25 DIAGNOSIS — E039 Hypothyroidism, unspecified: Secondary | ICD-10-CM | POA: Insufficient documentation

## 2015-02-25 DIAGNOSIS — Z8739 Personal history of other diseases of the musculoskeletal system and connective tissue: Secondary | ICD-10-CM | POA: Insufficient documentation

## 2015-02-25 DIAGNOSIS — Z87828 Personal history of other (healed) physical injury and trauma: Secondary | ICD-10-CM | POA: Insufficient documentation

## 2015-02-25 MED ORDER — LEVOTHYROXINE SODIUM 75 MCG PO TABS: 75.0000 ug | ORAL_TABLET | Freq: Every day | ORAL | Status: DC
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
## 2015-02-25 NOTE — ED Notes (Signed)
Per pt states, out of Synthroid and needs script

## 2015-02-25 NOTE — Discharge Instructions (Signed)
If you develop new, or concerning changes in your condition, please be sure to return here, otherwise be sure to follow-up with your primary care physician.

## 2015-03-24 ENCOUNTER — Emergency Department (HOSPITAL_COMMUNITY)
Admission: EM | Admit: 2015-03-24 | Discharge: 2015-03-24 | Disposition: A | Discharge status: Home / Self Care | Payer: Medicaid - Out of State | Attending: Emergency Medicine | Admitting: Emergency Medicine

## 2015-03-24 ENCOUNTER — Encounter (HOSPITAL_COMMUNITY): Payer: Self-pay | Admitting: Emergency Medicine

## 2015-03-24 DIAGNOSIS — Z79899 Other long term (current) drug therapy: Secondary | ICD-10-CM | POA: Insufficient documentation

## 2015-03-24 DIAGNOSIS — F319 Bipolar disorder, unspecified: Secondary | ICD-10-CM | POA: Diagnosis not present

## 2015-03-24 DIAGNOSIS — L739 Follicular disorder, unspecified: Secondary | ICD-10-CM

## 2015-03-24 DIAGNOSIS — Z87828 Personal history of other (healed) physical injury and trauma: Secondary | ICD-10-CM | POA: Insufficient documentation

## 2015-03-24 DIAGNOSIS — E039 Hypothyroidism, unspecified: Secondary | ICD-10-CM | POA: Diagnosis not present

## 2015-03-24 DIAGNOSIS — R2232 Localized swelling, mass and lump, left upper limb: Secondary | ICD-10-CM | POA: Diagnosis present

## 2015-03-24 NOTE — Discharge Instructions (Signed)
Folliculitis °Folliculitis is redness, soreness, and swelling (inflammation) of the hair follicles. This condition can occur anywhere on the body. People with weakened immune systems, diabetes, or obesity have a greater risk of getting folliculitis. °CAUSES °· Bacterial infection. This is the most common cause. °· Fungal infection. °· Viral infection. °· Contact with certain chemicals, especially oils and tars. °Long-term folliculitis can result from bacteria that live in the nostrils. The bacteria may trigger multiple outbreaks of folliculitis over time. °SYMPTOMS °Folliculitis most commonly occurs on the scalp, thighs, legs, back, buttocks, and areas where hair is shaved frequently. An early sign of folliculitis is a small, white or yellow, pus-filled, itchy lesion (pustule). These lesions appear on a red, inflamed follicle. They are usually less than 0.2 inches (5 mm) wide. When there is an infection of the follicle that goes deeper, it becomes a boil or furuncle. A group of closely packed boils creates a larger lesion (carbuncle). Carbuncles tend to occur in hairy, sweaty areas of the body. °DIAGNOSIS  °Your caregiver can usually tell what is wrong by doing a physical exam. A sample may be taken from one of the lesions and tested in a lab. This can help determine what is causing your folliculitis. °TREATMENT  °Treatment may include: °· Applying warm compresses to the affected areas. °· Taking antibiotic medicines orally or applying them to the skin. °· Draining the lesions if they contain a large amount of pus or fluid. °· Laser hair removal for cases of long-lasting folliculitis. This helps to prevent regrowth of the hair. °HOME CARE INSTRUCTIONS °· Apply warm compresses to the affected areas as directed by your caregiver. °· If antibiotics are prescribed, take them as directed. Finish them even if you start to feel better. °· You may take over-the-counter medicines to relieve itching. °· Do not shave irritated  skin. °· Follow up with your caregiver as directed. °SEEK IMMEDIATE MEDICAL CARE IF:  °· You have increasing redness, swelling, or pain in the affected area. °· You have a fever. °MAKE SURE YOU: °· Understand these instructions. °· Will watch your condition. °· Will get help right away if you are not doing well or get worse. °  °This information is not intended to replace advice given to you by your health care provider. Make sure you discuss any questions you have with your health care provider. °  °Document Released: 05/20/2001 Document Revised: 04/01/2014 Document Reviewed: 06/11/2011 °Elsevier Interactive Patient Education ©2016 Elsevier Inc. ° °

## 2015-03-24 NOTE — ED Notes (Signed)
Pt. reports lump at left axilla onset 2 days ago with no drainage .
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
## 2015-04-03 ENCOUNTER — Emergency Department (HOSPITAL_COMMUNITY)
Admission: EM | Admit: 2015-04-03 | Discharge: 2015-04-03 | Disposition: A | Discharge status: Home / Self Care | Payer: Medicaid - Out of State | Attending: Emergency Medicine | Admitting: Emergency Medicine

## 2015-04-03 ENCOUNTER — Encounter (HOSPITAL_COMMUNITY): Payer: Self-pay

## 2015-04-03 DIAGNOSIS — Z8739 Personal history of other diseases of the musculoskeletal system and connective tissue: Secondary | ICD-10-CM | POA: Diagnosis not present

## 2015-04-03 DIAGNOSIS — Z79899 Other long term (current) drug therapy: Secondary | ICD-10-CM | POA: Insufficient documentation

## 2015-04-03 DIAGNOSIS — Z87828 Personal history of other (healed) physical injury and trauma: Secondary | ICD-10-CM | POA: Diagnosis not present

## 2015-04-03 DIAGNOSIS — E039 Hypothyroidism, unspecified: Secondary | ICD-10-CM | POA: Diagnosis not present

## 2015-04-03 DIAGNOSIS — F319 Bipolar disorder, unspecified: Secondary | ICD-10-CM | POA: Insufficient documentation

## 2015-04-03 DIAGNOSIS — Z760 Encounter for issue of repeat prescription: Secondary | ICD-10-CM | POA: Insufficient documentation

## 2015-04-03 LAB — TSH: TSH: 1.083 u[IU]/mL (ref 0.350–4.500)

## 2015-04-03 MED ORDER — LEVOTHYROXINE SODIUM 75 MCG PO TABS: 75.0000 ug | ORAL_TABLET | Freq: Every day | ORAL | Status: AC
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
## 2015-04-03 NOTE — ED Notes (Signed)
Pt needs his thyroid medication refilled

## 2015-04-03 NOTE — Discharge Instructions (Signed)
Medicine Refill at the Emergency Department °We have refilled your medicine today, but it is best for you to get refills through your primary health care provider's office. In the future, please plan ahead so you do not need to get refills from the emergency department. °If the medicine we refilled was a maintenance medicine, you may have received only enough to get you by until you are able to see your regular health care provider. °  °This information is not intended to replace advice given to you by your health care provider. Make sure you discuss any questions you have with your health care provider. °  °Document Released: 06/28/2003 Document Revised: 04/01/2014 Document Reviewed: 06/18/2013 °Elsevier Interactive Patient Education ©2016 Elsevier Inc. ° ° ° °Emergency Department Resource Guide °1) Find a Doctor and Pay Out of Pocket °Although you won't have to find out who is covered by your insurance plan, it is a good idea to ask around and get recommendations. You will then need to call the office and see if the doctor you have chosen will accept you as a new patient and what types of options they offer for patients who are self-pay. Some doctors offer discounts or will set up payment plans for their patients who do not have insurance, but you will need to ask so you aren't surprised when you get to your appointment. ° °2) Contact Your Local Health Department °Not all health departments have doctors that can see patients for sick visits, but many do, so it is worth a call to see if yours does. If you don't know where your local health department is, you can check in your phone book. The CDC also has a tool to help you locate your state's health department, and many state websites also have listings of all of their local health departments. ° °3) Find a Walk-in Clinic °If your illness is not likely to be very severe or complicated, you may want to try a walk in clinic. These are popping up all over the country  in pharmacies, drugstores, and shopping centers. They're usually staffed by nurse practitioners or physician assistants that have been trained to treat common illnesses and complaints. They're usually fairly quick and inexpensive. However, if you have serious medical issues or chronic medical problems, these are probably not your best option. ° °No Primary Care Doctor: °- Call [facility] at  832-8000 - they can help you locate a primary care doctor that  accepts your insurance, provides certain services, etc. °- Physician Referral Service- 1-800-533-3463 ° °Chronic Pain Problems: °Organization         Address  Phone   Notes  °[facility]  (336) 297-2271 Patients need to be referred by their primary care doctor.  ° °Medication Assistance: °Organization         Address  Phone   Notes  °[place] [facility] [address] °[place], [place] [address] (336) 641-8030 --Must be a resident of [place] °-- Must have NO insurance coverage whatsoever (no Medicaid/ Medicare, etc.) °-- The pt. MUST have a primary care doctor that directs their care regularly and follows them in the community °  °[facility]  (866) 331-1348   °[employer]  (888) 892-1162   ° °Agencies that provide inexpensive medical care: °Organization         Address  Phone   Notes  °[facility]  (336) 832-8035   °[facility]    (  336) 832-7272   °[facility] [address] °[place], [place] [address] (336) 832-4777   °[facility] [address], °[place] (336) 271-4999   °[facility]    (336) 373-0678   °[facility]    (336) 272-1050   °[facility] ° [address], [place] Phone:  (336) 832-4444, Fax:  (336) 832-4440 Hours of Operation:  9 am - 6 pm, M-F.  Also accepts Medicaid/Medicare and self-pay.  °[facility] ° [address],  [place] Phone: (336) 832-3150, Fax: (336) 832-3151. Hours of Operation:  8:30 am - 5:30 pm, M-F.  Also accepts Medicaid and self-pay.  °[facility] [place] [address], [place] Phone: (336) 878-6027   °[facility] [address], [place], [place] (336)723-1848, Ext. 123 Mondays & Thursdays: 7-9 AM.  First 15 patients are seen on a first come, first serve basis. °  ° °Medicaid-accepting [place] Providers: ° °Organization         Address  Phone   Notes  °[facility] [address], [place] (336) 641-2100 Also accepts self-pay patients.  °[facility] [address], [place] ° (336) 856-9996   °[facility] [address], [place] (336) 288-8857   °[facility] [address] [place] [address], [place] (336) 299-7000   °Veita Bland [address], [place]  ° (336) 373-1557 Only accepts [place] Access Medicaid patients after they have their name applied to their card.  ° °Self-Pay (no insurance) in [place]: ° °Organization         Address  Phone   Notes  °Sickle Cell Patients, [facility] [address], [place] (336) 832-1970   °[facility] [address], [place] (336) 832-4400   °[facility] ° [address], [place] (336) 992-4800   °[facility]/Dr. Osei-Bonsu ° [address] [place] [address], [place] or [address], [place] (336) 841-8500 Phone number for both [place] and [place] locations is the same.  °[facility] [address], [place] (336) 299-0000   °[facility] [address] [place] [address], [place] or [address] (336) 852-7530 °(336) 878-2260   °[facility] [address], [place] (336) 350-1642, phone; (336) 294-5005, fax Sees patients 1st and 3rd Saturday of every month.  Must not  qualify for public or private insurance (i.e. Medicaid, Medicare, [place] Health Choice, Veterans' Benefits) • Household income should be no more than 200% of the poverty level •The clinic cannot treat you if you are pregnant or think you are pregnant • Sexually transmitted diseases are not treated at the clinic.  ° ° °Dental Care: °Organization         Address  Phone  Notes  °[facility] [address], [place] (336) 641-6152 Accepts children up to age 21 who are enrolled in Medicaid or [place] Health Choice; pregnant women with a Medicaid card; and children who have applied for Medicaid or [place] Health Choice, but were declined, whose parents can pay a reduced fee at time of service.  °[facility] [place]  [address], [place] (336) 641-7733 Accepts children up   to age 21 who are enrolled in Medicaid or [place] Health Choice; pregnant women with a Medicaid card; and children who have applied for Medicaid or [place] Health Choice, but were declined, whose parents can pay a reduced fee at time of service.  °[facility] ° [address], [place] (336) 641-4533 Patients are seen by appointment only. Walk-ins are not accepted. [facility] will see patients 18 years of age and older. °Monday - Tuesday (8am-5pm) °Most Wednesdays (8:30-5pm) °$30 per visit, cash only  °[facility] ° [address], [place] (336) 641-4533 Patients are seen by appointment only. Walk-ins are not accepted. [facility] will see patients 18 years of age and older. °One Wednesday Evening (Monthly: Volunteer Based).  $30 per visit, cash only  °[facility]  (919) 537-3737 for adults; Children under age 4, call Graduate Pediatric Dentistry at (919) [phone]. Children aged 4-14, please call (919) [phone] to request a pediatric application. ° Dental services are provided  in all areas of dental care including fillings, crowns and bridges, complete and partial dentures, implants, gum treatment, root canals, and extractions. Preventive care is also provided. Treatment is provided to both adults and children. °Patients are selected via a lottery and there is often a waiting list. °  °[facility] [address], °[place] ° (336) 763-8833 [email or web address] °  °[facility] [address], [place], [place] (336)723-1848, Ext. 123 Second and Fourth Thursday of each month, opens at 6:30 AM; Clinic ends at 9 AM.  Patients are seen on a first-come first-served basis, and a limited number are seen during each clinic.  ° °[facility] ° [address], [place], [place] (336) 723-7904   Eligibility Requirements °You must have lived in [place], [place], or [place] counties for at least the last three months. °  You cannot be eligible for state or federal sponsored healthcare insurance, including Veterans Administration, Medicaid, or Medicare. °  You generally cannot be eligible for healthcare insurance through your employer.  °  How to apply: °Eligibility screenings are held every Tuesday and Wednesday afternoon from 1:00 pm until 4:00 pm. You do not need an appointment for the interview!  °[facility] [address], [place], [place] 336-631-2330   °[facility]  336-342-8273   °[facility]  336-703-3100   °[facility]  336-570-6415   ° °Behavioral Health Resources in the Community: °Intensive Outpatient Programs °Organization         Address  Phone  Notes  °[facility] [address], [place], [place] 336-878-6098   °[facility] [address], [place], [place] 336-832-9800   °[facility] [address], [place], [place] ° 336-882-2125   °[facility] [address],  °, [place]  1-800-853-5163 or 336-641-4981   °Substance Abuse Resources °Organization         Address  Phone  Notes  °[facility]  336-882-2125   °[facility]  336-784-9470   °[facility]  336-285-9073   °[facility]  336-845-3988   °Residential & Outpatient Substance Abuse Program  1-800-659-3381   °Psychological Services °Organization         Address  Phone  Notes  °[facility]  336- 832-9600   °[facility]  336- 378-7881   °[facility]   [address], [place] 1-800-853-5163 or 336-641-4981   ° °Mobile Crisis Teams °Organization         Address  Phone  Notes  °[facility], Mobile Crisis Care Unit  1-877-626-1772   °Assertive °[facility] ° [address] [place], [place] 336-834-9664   °Sharon DeEsch [address] °[place] 336-554-5454   ° °Self-Help/Support Groups °Organization         Address  Phone             Notes  °[facility] - variety of support groups  336- 373-1402 Call for more information  °Narcotics Anonymous (NA), [facility] [address], °[place]  2 meetings at this location  ° °Residential Treatment Programs °Organization         Address  Phone  Notes  °[facility] [address],    °[place]  1-866-801-8205   °[facility] ° [address], [place], [place] 704-293-8524   °[facility] [address], [place] 336-845-3988 Admissions: 8am-3pm M-F  °[facility] [address]    °[place], [place] 336-841-1104   °[facility] [address], [place], [place] 336-379-7146   °[facility] [address]  °[place], [place] 336-285-9073   °[facility] - Intensive Outpatient [address], [place], [place] 336-852-3033   °[facility] [address]  °[place], [place] 1-877-615-2722 or  336-784-9470   °[facility] ([facility] [address] [place], [place] 336-227-7417 Accepts Medicaid  °[facility] [address]  °[place] 1-800-659-3381 Substance Abuse/Addiction Treatment  ° °[place] Behavioral Health Resources °Organization         Address  Phone  Notes  °[facility]  (888) 581-9988   °Julie Brannon, PhD [address] [place], [place]   (336) 349-5553 or (336) 951-0000   °[facility]   [address] °[place], [place] (336) 349-4454   °[facility] [address], [place], [place] (336) 342-8316 Insurance/Medicaid/sponsorship through [facility]  °[facility] [address]                                    [place], [place] (336) 342-8316 Therapy/tele-psych/case  °[facility] [address]  ° [place], [place] (336) 349-2233    °Dr. Arfeen  (336) 349-4544   °[facility] [place]  [employer] [place] [facility] 1) [address], [place] °2) [address], [place] °3)  [address], [place] (336) 349-3220 °(336) 342-7768 ° °(336) 342-8140   °[place] [facility] (336) 342-1394 or (336) 342-3537 (After Hours)    ° ° ° °

## 2015-04-04 LAB — T4, FREE: Free T4: 0.96 ng/dL (ref 0.61–1.12)

## 2015-04-29 ENCOUNTER — Encounter (HOSPITAL_COMMUNITY): Payer: Self-pay | Admitting: Emergency Medicine

## 2015-04-29 ENCOUNTER — Emergency Department (HOSPITAL_COMMUNITY): Payer: Medicaid - Out of State

## 2015-04-29 ENCOUNTER — Emergency Department (HOSPITAL_COMMUNITY)
Admission: EM | Admit: 2015-04-29 | Discharge: 2015-04-30 | Disposition: A | Discharge status: Home / Self Care | Payer: Medicaid - Out of State | Attending: Emergency Medicine | Admitting: Emergency Medicine

## 2015-04-29 DIAGNOSIS — Z79899 Other long term (current) drug therapy: Secondary | ICD-10-CM | POA: Diagnosis not present

## 2015-04-29 DIAGNOSIS — E039 Hypothyroidism, unspecified: Secondary | ICD-10-CM | POA: Insufficient documentation

## 2015-04-29 DIAGNOSIS — Z87828 Personal history of other (healed) physical injury and trauma: Secondary | ICD-10-CM | POA: Diagnosis not present

## 2015-04-29 DIAGNOSIS — Z8739 Personal history of other diseases of the musculoskeletal system and connective tissue: Secondary | ICD-10-CM | POA: Diagnosis not present

## 2015-04-29 DIAGNOSIS — F319 Bipolar disorder, unspecified: Secondary | ICD-10-CM | POA: Insufficient documentation

## 2015-04-29 DIAGNOSIS — R05 Cough: Secondary | ICD-10-CM | POA: Diagnosis present

## 2015-04-29 DIAGNOSIS — B349 Viral infection, unspecified: Secondary | ICD-10-CM | POA: Diagnosis not present

## 2015-04-29 MED ORDER — GUAIFENESIN-CODEINE 100-10 MG/5ML PO SOLN: 5.0000 mL | Freq: Three times a day (TID) | ORAL | Status: AC | PRN

## 2015-04-29 MED ORDER — OXYMETAZOLINE HCL 0.05 % NA SOLN: 1.0000 | Freq: Two times a day (BID) | NASAL | Status: AC
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
## 2015-04-29 NOTE — ED Notes (Signed)
Came in with complaint of productive cough  and congestion x 2 days. Pt. Stated that he has been taking OTC cough medicine but not helping at all. Denies fever .

## 2015-04-29 NOTE — Discharge Instructions (Signed)
Viral Infections °A viral infection can be caused by different types of viruses. Most viral infections are not serious and resolve on their own. However, some infections may cause severe symptoms and may lead to further complications. °SYMPTOMS °Viruses can frequently cause: °· Minor sore throat. °· Aches and pains. °· Headaches. °· Runny nose. °· Different types of rashes. °· Watery eyes. °· Tiredness. °· Cough. °· Loss of appetite. °· Gastrointestinal infections, resulting in nausea, vomiting, and diarrhea. °These symptoms do not respond to antibiotics because the infection is not caused by bacteria. However, you might catch a bacterial infection following the viral infection. This is sometimes called a "superinfection." Symptoms of such a bacterial infection may include: °· Worsening sore throat with pus and difficulty swallowing. °· Swollen neck glands. °· Chills and a high or persistent fever. °· Severe headache. °· Tenderness over the sinuses. °· Persistent overall ill feeling (malaise), muscle aches, and tiredness (fatigue). °· Persistent cough. °· Yellow, green, or brown mucus production with coughing. °HOME CARE INSTRUCTIONS  °· Only take over-the-counter or prescription medicines for pain, discomfort, diarrhea, or fever as directed by your caregiver. °· Drink enough water and fluids to keep your urine clear or pale yellow. Sports drinks can provide valuable electrolytes, sugars, and hydration. °· Get plenty of rest and maintain proper nutrition. Soups and broths with crackers or rice are fine. °SEEK IMMEDIATE MEDICAL CARE IF:  °· You have severe headaches, shortness of breath, chest pain, neck pain, or an unusual rash. °· You have uncontrolled vomiting, diarrhea, or you are unable to keep down fluids. °· You or your child has an oral temperature above 102° F (38.9° C), not controlled by medicine. °· Your baby is older than 3 months with a rectal temperature of 102° F (38.9° C) or higher. °· Your baby is 3  months old or younger with a rectal temperature of 100.4° F (38° C) or higher. °MAKE SURE YOU:  °· Understand these instructions. °· Will watch your condition. °· Will get help right away if you are not doing well or get worse. °  °This information is not intended to replace advice given to you by your health care provider. Make sure you discuss any questions you have with your health care provider. °  °Document Released: 12/19/2004 Document Revised: 06/03/2011 Document Reviewed: 08/17/2014 °Elsevier Interactive Patient Education ©2016 Elsevier Inc. ° °
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
## 2015-05-18 ENCOUNTER — Emergency Department (HOSPITAL_COMMUNITY)
Admission: EM | Admit: 2015-05-18 | Discharge: 2015-05-18 | Disposition: A | Discharge status: Home / Self Care | Payer: Medicaid - Out of State | Attending: Emergency Medicine | Admitting: Emergency Medicine

## 2015-05-18 ENCOUNTER — Emergency Department (HOSPITAL_COMMUNITY): Payer: Medicaid - Out of State

## 2015-05-18 ENCOUNTER — Encounter (HOSPITAL_COMMUNITY): Payer: Self-pay | Admitting: Emergency Medicine

## 2015-05-18 DIAGNOSIS — Z8739 Personal history of other diseases of the musculoskeletal system and connective tissue: Secondary | ICD-10-CM | POA: Insufficient documentation

## 2015-05-18 DIAGNOSIS — Z79899 Other long term (current) drug therapy: Secondary | ICD-10-CM | POA: Diagnosis not present

## 2015-05-18 DIAGNOSIS — R1013 Epigastric pain: Secondary | ICD-10-CM | POA: Insufficient documentation

## 2015-05-18 DIAGNOSIS — B349 Viral infection, unspecified: Secondary | ICD-10-CM | POA: Diagnosis not present

## 2015-05-18 DIAGNOSIS — Z87828 Personal history of other (healed) physical injury and trauma: Secondary | ICD-10-CM | POA: Diagnosis not present

## 2015-05-18 DIAGNOSIS — R5383 Other fatigue: Secondary | ICD-10-CM

## 2015-05-18 DIAGNOSIS — E039 Hypothyroidism, unspecified: Secondary | ICD-10-CM | POA: Diagnosis not present

## 2015-05-18 DIAGNOSIS — F319 Bipolar disorder, unspecified: Secondary | ICD-10-CM | POA: Insufficient documentation

## 2015-05-18 LAB — CBC WITH DIFFERENTIAL/PLATELET
BASOS ABS: 0 10*3/uL (ref 0.0–0.1)
BASOS PCT: 0 %
EOS ABS: 0.1 10*3/uL (ref 0.0–0.7)
EOS PCT: 3 %
HCT: 38.6 % — ABNORMAL LOW (ref 39.0–52.0)
Hemoglobin: 13 g/dL (ref 13.0–17.0)
Lymphocytes Relative: 52 %
Lymphs Abs: 2.5 10*3/uL (ref 0.7–4.0)
MCH: 32.1 pg (ref 26.0–34.0)
MCHC: 33.7 g/dL (ref 30.0–36.0)
MCV: 95.3 fL (ref 78.0–100.0)
MONO ABS: 0.4 10*3/uL (ref 0.1–1.0)
Monocytes Relative: 8 %
Neutro Abs: 1.7 10*3/uL (ref 1.7–7.7)
Neutrophils Relative %: 37 %
PLATELETS: 183 10*3/uL (ref 150–400)
RBC: 4.05 MIL/uL — ABNORMAL LOW (ref 4.22–5.81)
RDW: 14.5 % (ref 11.5–15.5)
WBC: 4.8 10*3/uL (ref 4.0–10.5)

## 2015-05-18 LAB — URINALYSIS, ROUTINE W REFLEX MICROSCOPIC
BILIRUBIN URINE: NEGATIVE
Glucose, UA: NEGATIVE mg/dL
HGB URINE DIPSTICK: NEGATIVE
KETONES UR: NEGATIVE mg/dL
Leukocytes, UA: NEGATIVE
NITRITE: NEGATIVE
PH: 6.5 (ref 5.0–8.0)
Protein, ur: NEGATIVE mg/dL
Specific Gravity, Urine: 1.007 (ref 1.005–1.030)

## 2015-05-18 LAB — COMPREHENSIVE METABOLIC PANEL
ALBUMIN: 4 g/dL (ref 3.5–5.0)
ALT: 18 U/L (ref 17–63)
ANION GAP: 9 (ref 5–15)
AST: 24 U/L (ref 15–41)
Alkaline Phosphatase: 41 U/L (ref 38–126)
BILIRUBIN TOTAL: 0.6 mg/dL (ref 0.3–1.2)
BUN: 13 mg/dL (ref 6–20)
CO2: 22 mmol/L (ref 22–32)
Calcium: 8.7 mg/dL — ABNORMAL LOW (ref 8.9–10.3)
Chloride: 108 mmol/L (ref 101–111)
Creatinine, Ser: 1.19 mg/dL (ref 0.61–1.24)
GFR calc non Af Amer: >60 mL/min (ref >60)
GLUCOSE: 97 mg/dL (ref 65–99)
POTASSIUM: 3.8 mmol/L (ref 3.5–5.1)
SODIUM: 139 mmol/L (ref 135–145)
TOTAL PROTEIN: 7 g/dL (ref 6.5–8.1)

## 2015-05-18 LAB — CK: Total CK: 117 U/L (ref 49–397)

## 2015-05-18 MED ORDER — ONDANSETRON HCL 4 MG/2ML IJ SOLN: 4.0000 mg | Freq: Once | INTRAMUSCULAR | Status: DC

## 2015-05-18 MED ORDER — SODIUM CHLORIDE 0.9 % IV BOLUS (SEPSIS)
1000.0000 mL | Freq: Once | INTRAVENOUS | Status: AC
  Administered 2015-05-18: 1000 mL via INTRAVENOUS

## 2015-05-18 MED ORDER — NAPROXEN 250 MG PO TABS: 250.0000 mg | ORAL_TABLET | Freq: Two times a day (BID) | ORAL | Status: AC | PRN

## 2015-05-18 MED ORDER — PROMETHAZINE HCL 25 MG PO TABS: 25.0000 mg | ORAL_TABLET | Freq: Four times a day (QID) | ORAL | Status: AC | PRN
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
## 2015-05-18 NOTE — ED Notes (Addendum)
Patient transported to X-ray 

## 2015-05-18 NOTE — ED Notes (Signed)
Pt states he feels dehydrated  Pt states his legs feel like they are going to cramp up  Pt is c/o general fatigue and had some vomiting this morning  Pt states his urine is dark in color

## 2015-05-18 NOTE — Discharge Instructions (Signed)
Viral Infections °A viral infection can be caused by different types of viruses. Most viral infections are not serious and resolve on their own. However, some infections may cause severe symptoms and may lead to further complications. °SYMPTOMS °Viruses can frequently cause: °· Minor sore throat. °· Aches and pains. °· Headaches. °· Runny nose. °· Different types of rashes. °· Watery eyes. °· Tiredness. °· Cough. °· Loss of appetite. °· Gastrointestinal infections, resulting in nausea, vomiting, and diarrhea. °These symptoms do not respond to antibiotics because the infection is not caused by bacteria. However, you might catch a bacterial infection following the viral infection. This is sometimes called a "superinfection." Symptoms of such a bacterial infection may include: °· Worsening sore throat with pus and difficulty swallowing. °· Swollen neck glands. °· Chills and a high or persistent fever. °· Severe headache. °· Tenderness over the sinuses. °· Persistent overall ill feeling (malaise), muscle aches, and tiredness (fatigue). °· Persistent cough. °· Yellow, green, or brown mucus production with coughing. °HOME CARE INSTRUCTIONS  °· Only take over-the-counter or prescription medicines for pain, discomfort, diarrhea, or fever as directed by your caregiver. °· Drink enough water and fluids to keep your urine clear or pale yellow. Sports drinks can provide valuable electrolytes, sugars, and hydration. °· Get plenty of rest and maintain proper nutrition. Soups and broths with crackers or rice are fine. °SEEK IMMEDIATE MEDICAL CARE IF:  °· You have severe headaches, shortness of breath, chest pain, neck pain, or an unusual rash. °· You have uncontrolled vomiting, diarrhea, or you are unable to keep down fluids. °· You or your child has an oral temperature above 102° F (38.9° C), not controlled by medicine. °· Your baby is older than 3 months with a rectal temperature of 102° F (38.9° C) or higher. °· Your baby is 3  months old or younger with a rectal temperature of 100.4° F (38° C) or higher. °MAKE SURE YOU:  °· Understand these instructions. °· Will watch your condition. °· Will get help right away if you are not doing well or get worse. °  °This information is not intended to replace advice given to you by your health care provider. Make sure you discuss any questions you have with your health care provider. °  °Document Released: 12/19/2004 Document Revised: 06/03/2011 Document Reviewed: 08/17/2014 °Elsevier Interactive Patient Education ©2016 Elsevier Inc. ° °

## 2016-09-24 ENCOUNTER — Emergency Department (HOSPITAL_COMMUNITY): Payer: Medicaid - Out of State

## 2016-09-24 ENCOUNTER — Encounter (HOSPITAL_COMMUNITY): Payer: Self-pay | Admitting: Emergency Medicine

## 2016-09-24 ENCOUNTER — Emergency Department (HOSPITAL_COMMUNITY)
Admission: EM | Admit: 2016-09-24 | Discharge: 2016-09-24 | Disposition: A | Discharge status: Home / Self Care | Payer: Medicaid - Out of State | Attending: Obstetrics and Gynecology | Admitting: Obstetrics and Gynecology

## 2016-09-24 DIAGNOSIS — E039 Hypothyroidism, unspecified: Secondary | ICD-10-CM | POA: Insufficient documentation

## 2016-09-24 DIAGNOSIS — J029 Acute pharyngitis, unspecified: Secondary | ICD-10-CM | POA: Diagnosis present

## 2016-09-24 LAB — RAPID STREP SCREEN (MED CTR MEBANE ONLY): Streptococcus, Group A Screen (Direct): NEGATIVE

## 2016-09-24 MED ORDER — LIDOCAINE VISCOUS 2 % MT SOLN
15.0000 mL | Freq: Once | OROMUCOSAL | Status: AC
  Administered 2016-09-24: 15 mL via OROMUCOSAL
  Filled 2016-09-24: qty 15

## 2016-09-24 MED ORDER — IBUPROFEN 800 MG PO TABS: 800.0000 mg | ORAL_TABLET | Freq: Three times a day (TID) | ORAL | 0 refills | Status: AC

## 2016-09-24 MED ORDER — ERYTHROMYCIN 5 MG/GM OP OINT: TOPICAL_OINTMENT | OPHTHALMIC | 0 refills | Status: AC

## 2016-09-24 MED ORDER — IBUPROFEN 800 MG PO TABS
800.0000 mg | ORAL_TABLET | Freq: Once | ORAL | Status: AC
  Administered 2016-09-24: 800 mg via ORAL
  Filled 2016-09-24: qty 1

## 2016-09-24 MED ORDER — ACETAMINOPHEN 500 MG PO TABS
1000.0000 mg | ORAL_TABLET | Freq: Once | ORAL | Status: AC
  Administered 2016-09-24: 1000 mg via ORAL
  Filled 2016-09-24: qty 2
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
## 2016-09-24 NOTE — ED Triage Notes (Signed)
Pt from home with complaints of SOB and sore throat x 1 month. Pt's trachea is midline and pt has clear lung sounds

## 2016-09-26 LAB — CULTURE, GROUP A STREP (THRC)
